# Patient Record
Sex: Male | Born: 1995 | Race: Black or African American | Hispanic: No | Marital: Single | Smoking: Never smoker
Health system: Southern US, Community
[De-identification: ages and names within clinical notes are randomized; demographics above are authoritative.]

## PROBLEM LIST (undated history)

## (undated) DIAGNOSIS — J40 Bronchitis, not specified as acute or chronic: Secondary | ICD-10-CM

## (undated) DIAGNOSIS — K219 Gastro-esophageal reflux disease without esophagitis: Secondary | ICD-10-CM

## (undated) DIAGNOSIS — K439 Ventral hernia without obstruction or gangrene: Secondary | ICD-10-CM

## (undated) DIAGNOSIS — I10 Essential (primary) hypertension: Secondary | ICD-10-CM

## (undated) DIAGNOSIS — T7840XA Allergy, unspecified, initial encounter: Secondary | ICD-10-CM

## (undated) DIAGNOSIS — R51 Headache: Secondary | ICD-10-CM

## (undated) DIAGNOSIS — J449 Chronic obstructive pulmonary disease, unspecified: Secondary | ICD-10-CM

## (undated) DIAGNOSIS — F419 Anxiety disorder, unspecified: Secondary | ICD-10-CM

## (undated) DIAGNOSIS — R519 Headache, unspecified: Secondary | ICD-10-CM

## (undated) HISTORY — DX: Anxiety disorder, unspecified: F41.9

## (undated) HISTORY — DX: Chronic obstructive pulmonary disease, unspecified: J44.9

## (undated) HISTORY — DX: Essential (primary) hypertension: I10

## (undated) HISTORY — PX: HERNIA REPAIR: SHX51

## (undated) HISTORY — DX: Allergy, unspecified, initial encounter: T78.40XA

---
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
## 2017-08-10 ENCOUNTER — Ambulatory Visit (HOSPITAL_COMMUNITY)
Admission: RE | Admit: 2017-08-10 | Discharge: 2017-08-10 | Disposition: A | Discharge status: Home / Self Care | Payer: BLUE CROSS/BLUE SHIELD | Source: Ambulatory Visit | Attending: Family Medicine | Admitting: Family Medicine

## 2017-08-10 ENCOUNTER — Other Ambulatory Visit: Payer: Self-pay

## 2017-08-10 ENCOUNTER — Ambulatory Visit (INDEPENDENT_AMBULATORY_CARE_PROVIDER_SITE_OTHER): Payer: BLUE CROSS/BLUE SHIELD | Admitting: Internal Medicine

## 2017-08-10 ENCOUNTER — Encounter: Payer: Self-pay | Admitting: Internal Medicine

## 2017-08-10 VITALS — BP 122/84 | HR 77 | Temp 98.6°F | Ht 74.5 in | Wt 174.0 lb

## 2017-08-10 DIAGNOSIS — K2900 Acute gastritis without bleeding: Secondary | ICD-10-CM | POA: Diagnosis not present

## 2017-08-10 DIAGNOSIS — K2970 Gastritis, unspecified, without bleeding: Secondary | ICD-10-CM | POA: Insufficient documentation

## 2017-08-10 DIAGNOSIS — R079 Chest pain, unspecified: Secondary | ICD-10-CM | POA: Insufficient documentation

## 2017-08-10 DIAGNOSIS — K5900 Constipation, unspecified: Secondary | ICD-10-CM | POA: Insufficient documentation

## 2017-08-10 MED ORDER — OMEPRAZOLE 40 MG PO CPDR: 40.0000 mg | DELAYED_RELEASE_CAPSULE | Freq: Every day | ORAL | 0 refills | Status: DC

## 2017-08-10 MED ORDER — POLYETHYLENE GLYCOL 3350 17 GM/SCOOP PO POWD: 1.0000 | Freq: Once | ORAL | 0 refills | Status: AC

## 2017-08-10 NOTE — Patient Instructions (Addendum)
It was so nice to see you!  For your heart- we did an EKG today that was completely normal. Your oxygen did not drop with walking, so I don't think your chest pain is coming from your heart. I would recommend taking Tylenol every 6 hours.   For your indigestion- I have prescribed a medication called Prilosec. Please take this once daily.  For your constipation- Please mix 1 capful into 8oz of water daily. If you do not have a good bowel movement, then use 1 capful twice a day. Keep increasing it until you have 1 good bowel movement per day.  We will see you back in 2-4 weeks.  -Dr. Brett Albino

## 2017-08-10 NOTE — Assessment & Plan Note (Signed)
Having to strain and only having 1 small bowel movement per day. - Start Miralax 1 capful daily. Increase until having 1 soft BM daily. - Follow-up in 2 weeks.

## 2017-08-10 NOTE — Assessment & Plan Note (Signed)
Patient with bloating, epigastric pain, and nausea. Likely worsened by recent Ibuprofen use for costochondritis. Not having severe pain, so think ulcer is less likely. - Prilosec 40mg  daily x 8 weeks - Advised patient to refrain from consuming spicy foods - Follow-up in 2 weeks to see if this is improving

## 2017-08-10 NOTE — Assessment & Plan Note (Addendum)
Likely secondary to costochondritis, given that patient has tenderness to palpation of the intercostal muscles on exam. GERD also likely contributing, as patient has been having a lot of heartburn (see separate problem). Per patient, he was told that he has an enlarged heart at a hospital in [place], but his discharge paperwork from that ED visit only notes costochondritis. Patient endorsing fatigue and shortness of breath, so concern that there may be something more serious going on such as viral myocarditis; however, patient does not have any lower extremity edema, JVD, or crackles to suggest heart failure and fluid overload. - EKG performed in clinic today and showed NSR without any signs of LVH - Patient ambulated briskly with pulse ox and O2 saturations decreased from 98% to 95-96%.  - Take Tylenol instead of Ibuprofen for pain (due to epigastric pain and heartburn- see separate problem) - Advised patient to refrain from strenuous exercise for now. Follow-up in 2 weeks for recheck. May need to consider ECHO if his symptoms persist. Will hold off on this for now, as he is not showing signs of fluid overload and he is well-appearing on exam. - Precepted with attending

## 2017-08-10 NOTE — Progress Notes (Signed)
[facility] Phone: [phone]  Subjective:  Jeffrey Rangel is a 21 year old male presenting to clinic with chest pain, heartburn, and constipation.  Chest Pain: Has been going on for the last two weeks. The pain is "sharp" and left sided. The pain comes and goes spontaneously. Not worse with walking or activity. The pain is worse with laying flat. He also notes shortness of breath with laying flat. The pain is better with taking a deep breathing. No lower extremity edema. He also endorses fatigue. No URI symptoms. He was seen in the ED in [place] for this. They "did a lot of different tests and he was told he had an enlarged heart". They brought the discharge instructions from the ED visit, which just notes costochondritis and recommends NSAIDs.   Heartburn/Epigastric Pain: Has been going on for the last two weeks. Also notes bloating. Feels like he has a lot of pressure in his upper abdomen that he can't release. Unable to burp or pass gas. Also has epigastric pain that is worse after eating. He endorses nausea, but has not had any vomiting. He has stopped eating spicy foods, which has helped. He has been taking Ibuprofen recently for his chest pain. No melena.  Constipation: Has been going on for a month. He is having bowel movements every day, but they are often very small and he is having to strain. No hematochezia. He has taken a laxative pill once before, which helped for a short period of time.  ROS: See HPI for pertinent positives and negatives  Past Medical History- allergic rhinitis, eczema  Family history reviewed for today's visit. No changes.  Social history- patient is a never smoker  Objective: BP 122/84   Pulse 77   Temp 98.6 F (37 C) (Oral)   Ht 6' 2.5" (1.892 m)   Wt 174 lb (78.9 kg)   SpO2 98%   BMI 22.04 kg/m  Gen: NAD, alert, well-appearing. HEENT: NCAT, EOMI, MMM Neck: FROM, supple, no JVD CV: RRR, no murmur, +tenderness to  palpation of the intercostal spaces on the left side of the chest Resp: CTABL, no wheezes, normal work of breathing GI: No epigastric pain, +mild tenderness in the LLQ, no rebound, no guarding Msk: No edema Neuro: Alert and oriented, no gross deficits Skin: No rashes, no lesions Psych: Appropriate behavior  Assessment/Plan: Chest Pain: Likely secondary to costochondritis, given that patient has tenderness to palpation of the intercostal muscles on exam. GERD also likely contributing, as patient has been having a lot of heartburn (see separate problem). Per patient, he was told that he has an enlarged heart at a hospital in [place], but his discharge paperwork from that ED visit only notes costochondritis. Patient endorsing fatigue and shortness of breath, so concern that there may be something more serious going on such as viral myocarditis; however, patient does not have any lower extremity edema, JVD, or crackles to suggest heart failure and fluid overload. - EKG performed in clinic today and showed NSR without any signs of LVH - Patient ambulated briskly with pulse ox and O2 saturations decreased from 98% to 95-96%.  - Take Tylenol instead of Ibuprofen for pain (due to epigastric pain and heartburn- see separate problem) - Advised patient to refrain from strenuous exercise for now. Follow-up in 2 weeks for recheck. May need to consider ECHO if his symptoms persist. Will hold off on this for now, as he is not showing signs of fluid overload and he is well-appearing  on exam. - Precepted with attending.  Gastritis: Patient with bloating, epigastric pain, and nausea. Likely worsened by recent Ibuprofen use for costochondritis. Not having severe pain, so think ulcer is less likely. - Prilosec 40mg  daily x 8 weeks - Advised patient to refrain from consuming spicy foods - Follow-up in 2 weeks to see if this is improving  Constipation: Having to strain and only having 1 small bowel movement  per day. - Start Miralax 1 capful daily. Increase until having 1 soft BM daily. - Follow-up in 2 weeks.   Hyman Bible, MD PGY-3

## 2017-08-17 ENCOUNTER — Telehealth: Payer: Self-pay | Admitting: *Deleted

## 2017-08-17 ENCOUNTER — Other Ambulatory Visit: Payer: Self-pay | Admitting: Internal Medicine

## 2017-08-17 MED ORDER — RANITIDINE HCL 150 MG PO TABS: 150.0000 mg | ORAL_TABLET | Freq: Every day | ORAL | 0 refills | Status: DC

## 2017-08-17 NOTE — Telephone Encounter (Signed)
Patient's mom left message on nurse line stating patient is having problems with omeprazole. States causing urinary problems and problems with "private parts." Requesting different medication. Hubbard Hartshorn, RN, BSN

## 2017-08-17 NOTE — Telephone Encounter (Signed)
New prescription sent to pharmacy. Thanks

## 2017-09-02 ENCOUNTER — Ambulatory Visit: Payer: BLUE CROSS/BLUE SHIELD | Admitting: Internal Medicine

## 2017-09-09 ENCOUNTER — Ambulatory Visit: Payer: Self-pay | Admitting: Internal Medicine

## 2017-09-27 ENCOUNTER — Encounter: Payer: Self-pay | Admitting: Family Medicine

## 2017-09-27 ENCOUNTER — Ambulatory Visit (INDEPENDENT_AMBULATORY_CARE_PROVIDER_SITE_OTHER): Payer: BLUE CROSS/BLUE SHIELD | Admitting: Family Medicine

## 2017-09-27 VITALS — BP 92/58 | HR 75 | Temp 98.8°F | Ht 74.5 in | Wt 171.2 lb

## 2017-09-27 DIAGNOSIS — K219 Gastro-esophageal reflux disease without esophagitis: Secondary | ICD-10-CM

## 2017-09-27 NOTE — Progress Notes (Signed)
    Subjective:  Jeffrey Rangel is a 22 y.o. male who presents to the [facility] today with a chief complaint of chest pressure and feelings of abdominal discomfort.   HPI:  Heartburn  Patient reports chest pressure, heartburn feelings of abdominal discomfort with urge but inability to burp.  He denies any shortness of breath, tachycardia, nausea, vomiting or diarrhea.  Was seen by Dr. Brett Albino on 08/10/2017 reporting feelings of bloating pressure and inability to burp or pass gas.  Also reported that certain foods including fried foods and spicy foods worsen his symptoms and was prescribed omeprazole 40 mg daily for a course of 8 weeks.  Symptoms improved over that time, however since stopping the medication he has begun eating fried foods again over the past few days and symptoms have been worsening over that time period.  PMH: Gastritis, constipation Tobacco use: non-smoker Medication: reviewed and updated ROS: see HPI   Objective:  Physical Exam: BP (!) 92/58   Pulse 75   Temp 98.8 F (37.1 C) (Oral)   Ht 6' 2.5" (1.892 m)   Wt 171 lb 3.2 oz (77.7 kg)   SpO2 96%   BMI 21.69 kg/m   Gen: 22 year old male in NAD, resting comfortably CV: RRR with no murmurs appreciated Pulm: NWOB, CTAB with no crackles, wheezes, or rhonchi GI: Normal bowel sounds present. Soft, Nontender, Nondistended. MSK: no edema, cyanosis, or clubbing noted Skin: warm, dry Neuro: grossly normal, moves all extremities Psych: Normal affect and thought content  No results found for this or any previous visit (from the past 72 hour(s)).   Assessment/Plan:  GERD Initially diagnosed with presumed gastritis 2 months ago and symptoms improved with course of omeprazole and change in diet essentially eliminating fried foods and other foods known to exacerbate GERD.  Since stopping the medication patient has had worsening of symptoms after be getting to take and fried foods.  Have recommended the patient continue an  additional 4 weeks of omeprazole, limited fried foods, chocolate, caffeine, spearmint, tobacco, alcohol and give himself at least 2 hours to digest before lying down.  Fortunately he has no red flag signs or symptoms at this time.  If he has no improvement of symptoms during this course could add carafate and/or consider workup for H. Pylori/endoscopy to rule out peptic ulcer.   Jacquelynne Guedes L. Rosalyn Gess, [place] Resident PGY-2 09/28/2017 10:27 AM

## 2017-09-27 NOTE — Patient Instructions (Signed)
Jeffrey Rangel, your seen today for chest and abdominal discomfort and reports of some acid reflux and feeling like you need to burp.  Most likely this is acid reflux.  Symptoms worsening over the past few days after eating fried chicken at work I think it is reasonable to think that your symptoms are all stemming from acid reflux.  I would continue the medication and Dr. Brett Albino prescribed you daily for the next 4 weeks and avoid fried foods, chocolate, spicy foods and any tobacco products.  Additionally you could avoid lying down for about 2 hours after eating and raise the head of your bed.   If you develop any vomiting with blood or in your stool for develop any fevers, chills please come back to be seen sooner.  Take care, [name], [facility] Resident PGY-2 09/27/2017 5:06 PM

## 2017-09-28 ENCOUNTER — Encounter: Payer: Self-pay | Admitting: Family Medicine

## 2017-12-20 ENCOUNTER — Ambulatory Visit (HOSPITAL_COMMUNITY)
Admission: EM | Admit: 2017-12-20 | Discharge: 2017-12-20 | Disposition: A | Discharge status: Home / Self Care | Payer: BLUE CROSS/BLUE SHIELD | Attending: Family Medicine | Admitting: Family Medicine

## 2017-12-20 ENCOUNTER — Encounter (HOSPITAL_COMMUNITY): Payer: Self-pay | Admitting: Emergency Medicine

## 2017-12-20 DIAGNOSIS — W57XXXA Bitten or stung by nonvenomous insect and other nonvenomous arthropods, initial encounter: Secondary | ICD-10-CM

## 2017-12-20 DIAGNOSIS — S40862A Insect bite (nonvenomous) of left upper arm, initial encounter: Secondary | ICD-10-CM

## 2017-12-20 DIAGNOSIS — L03818 Cellulitis of other sites: Secondary | ICD-10-CM

## 2017-12-20 DIAGNOSIS — S76311A Strain of muscle, fascia and tendon of the posterior muscle group at thigh level, right thigh, initial encounter: Secondary | ICD-10-CM

## 2017-12-20 MED ORDER — CEPHALEXIN 500 MG PO CAPS: 500.0000 mg | ORAL_CAPSULE | Freq: Two times a day (BID) | ORAL | 0 refills | Status: DC

## 2017-12-20 MED ORDER — IBUPROFEN 800 MG PO TABS: 800.0000 mg | ORAL_TABLET | Freq: Four times a day (QID) | ORAL | 0 refills | Status: DC | PRN

## 2017-12-20 NOTE — ED Provider Notes (Signed)
[facility]    CSN: [id number] Arrival date & time: 12/20/17  1346     History   Chief Complaint Chief Complaint  Patient presents with  . Insect Bite    HPI Jeffrey Rangel is a 22 y.o. male.   HPI Here for insect bite for 2 days Left deltoid Unknown insect He states that over the last 24 hours his arm has become more swollen and painful.   No Fever, chills. No known allergies to insect bites Otherwise healthy 22 year old  Past Medical History:  Diagnosis Date  . 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 Sinus headache     Patient Active Problem List   Diagnosis Date Noted  . Chest pain 08/10/2017  . Gastritis 08/10/2017  . Constipation 08/10/2017  . Supraumbilical hernia 88/50/2774  . ECZEMA 03/04/2009  . ALLERGIC RHINITIS 04/11/2007    Past Surgical History:  Procedure Laterality Date  . EPIGASTRIC HERNIA REPAIR N/A 03/06/2014   Procedure: HERNIA REPAIR EPIGASTRIC PEDIATRIC X2;  Surgeon: Jerilynn Mages. Gerald Stabs, MD;  Location: [facility];  Service: Pediatrics;  Laterality: N/A;  . SCROTAL EXPLORATION Bilateral 07/08/2013   Procedure: SCROTAL EXPLORATION WITH BILATERAL ORICHIOPEXY;  Surgeon: Franchot Gallo, MD;  Location: [facility];  Service: Urology;  Laterality: Bilateral;       Home Medications    Prior to Admission medications   Medication Sig Start Date End Date Taking? Authorizing Provider  cephALEXin (KEFLEX) 500 MG capsule Take 1 capsule (500 mg total) by mouth 2 (two) times daily. 12/20/17   Raylene Everts, MD  ibuprofen (ADVIL,MOTRIN) 800 MG tablet Take 1 tablet (800 mg total) by mouth every 6 (six) hours as needed. 12/20/17   Raylene Everts, MD    Family History Family History  Problem Relation Age of Onset  . Liver disease Father   . Hypertension Mother   . Asthma Mother   . Asthma Brother   . Diabetes Maternal Grandmother   . Diabetes Maternal Grandfather     Social History Social History   Tobacco Use  .  Smoking status: Never Smoker  . Smokeless tobacco: Never Used  Substance Use Topics  . Alcohol use: No  . Drug use: No     Allergies   Other   Review of Systems Review of Systems  Constitutional: Negative for appetite change, chills, fatigue and fever.  Skin: Positive for rash and wound.  All other systems reviewed and are negative.    Physical Exam Triage Vital Signs ED Triage Vitals [12/20/17 1357]  Enc Vitals Group     BP 119/76     Pulse Rate 67     Resp 18     Temp 98.3 F (36.8 C)     Temp src      SpO2 100 %     Weight      Height      Head Circumference      Peak Flow      Pain Score      Pain Loc      Pain Edu?      Excl. in [place]?    No data found.  Updated Vital Signs BP 119/76   Pulse 67   Temp 98.3 F (36.8 C)   Resp 18   SpO2 100%   Physical Exam  Constitutional: He appears well-developed and well-nourished. No distress.  HENT:  Head: Normocephalic and atraumatic.  Eyes: Conjunctivae are normal.  Neck: Neck supple.  Cardiovascular: Normal rate and regular  rhythm.  No murmur heard. Pulmonary/Chest: Effort normal and breath sounds normal. He has no wheezes.  Abdominal: Soft. There is no tenderness.  Musculoskeletal: Normal range of motion. He exhibits no edema.  Neurological: He is alert.  Skin: Skin is warm and dry.  Left deltoid has a 15 mm superficial eschar, surrounding 4 cm induration and erythema.  Moderately tender.  No axillary adenopathy.  Psychiatric: He has a normal mood and affect.  Nursing note and vitals reviewed.    UC Treatments / Results    Medications Ordered in UC Medications - No data to display  Initial Impression / Assessment and Plan / UC Course  I have reviewed the triage vital signs and the nursing notes.  Pertinent labs & imaging results that were available during my care of the patient were reviewed by me and considered in my medical decision making (see chart for details).     Discussed cellulitis.   Superficial infection.  Signs and symptoms of worsening infection, reasons to return.  He appears understand and agree Final Clinical Impressions(s) / UC Diagnoses   Final diagnoses:  Insect bite of left upper arm, initial encounter  Cellulitis of other specified site     Discharge Instructions     Warmth to area Take the antibiotic until pain and redness resolve Ibuprofen as needed pain Return as needed   ED Prescriptions    Medication Sig Dispense Auth. Provider   ibuprofen (ADVIL,MOTRIN) 800 MG tablet Take 1 tablet (800 mg total) by mouth every 6 (six) hours as needed. 20 tablet Raylene Everts, MD   cephALEXin (KEFLEX) 500 MG capsule Take 1 capsule (500 mg total) by mouth 2 (two) times daily. 10 capsule Raylene Everts, MD     Controlled Substance Prescriptions [place] Controlled Substance Registry consulted? Not Applicable   Raylene Everts, MD 12/20/17 [phone]

## 2017-12-20 NOTE — ED Triage Notes (Signed)
Pt c/o possible insect bite on L shoulder x3 days.

## 2017-12-20 NOTE — Discharge Instructions (Signed)
Warmth to area Take the antibiotic until pain and redness resolve Ibuprofen as needed pain Return as needed

## 2018-01-06 ENCOUNTER — Ambulatory Visit: Payer: Self-pay | Admitting: Internal Medicine

## 2018-07-26 ENCOUNTER — Ambulatory Visit: Payer: Self-pay

## 2018-12-09 ENCOUNTER — Ambulatory Visit (HOSPITAL_COMMUNITY)
Admission: EM | Admit: 2018-12-09 | Discharge: 2018-12-09 | Disposition: A | Discharge status: Home / Self Care | Payer: Medicaid Other

## 2018-12-09 ENCOUNTER — Other Ambulatory Visit: Payer: Self-pay

## 2018-12-09 ENCOUNTER — Ambulatory Visit (INDEPENDENT_AMBULATORY_CARE_PROVIDER_SITE_OTHER): Payer: Medicaid Other

## 2018-12-09 ENCOUNTER — Other Ambulatory Visit: Payer: Self-pay | Admitting: Family Medicine

## 2018-12-09 ENCOUNTER — Encounter (HOSPITAL_COMMUNITY): Payer: Self-pay | Admitting: Emergency Medicine

## 2018-12-09 DIAGNOSIS — J309 Allergic rhinitis, unspecified: Secondary | ICD-10-CM

## 2018-12-09 DIAGNOSIS — J302 Other seasonal allergic rhinitis: Secondary | ICD-10-CM

## 2018-12-09 DIAGNOSIS — R0789 Other chest pain: Secondary | ICD-10-CM

## 2018-12-09 MED ORDER — IPRATROPIUM BROMIDE 0.03 % NA SOLN: 2.0000 | Freq: Three times a day (TID) | NASAL | 0 refills | Status: DC | PRN

## 2018-12-09 MED ORDER — ALBUTEROL SULFATE HFA 108 (90 BASE) MCG/ACT IN AERS: 2.0000 | INHALATION_SPRAY | RESPIRATORY_TRACT | 1 refills | Status: DC | PRN

## 2018-12-09 MED ORDER — PREDNISONE 20 MG PO TABS: 40.0000 mg | ORAL_TABLET | Freq: Every day | ORAL | 0 refills | Status: AC

## 2018-12-09 NOTE — Discharge Instructions (Addendum)
Your chest x-ray is normal. If symptoms of chest tightness and shortness of breath worsen and do improve with prescribed treatment , please go immediately to the Emergency Department.  Use albuterol inhaler as needed for shortness of breath and chest tightness. Complete all of the prednisone prescription-this will help with chest tightness, shortness of breath, and improve sinus symptoms. Continue Zyrtec.  I have prescribed Atrovent nasal spray for use as needed 3 times daily for allergy nasal symptoms.

## 2018-12-09 NOTE — ED Provider Notes (Signed)
[facility]    CSN: [id number] Arrival date & time: 12/09/18  1005     History   Chief Complaint Chief Complaint  Patient presents with  . Allergies    HPI Jeffrey Rangel is a 23 y.o. male.   HPI  Presents with a complaint of two days of chest tightness and shortness of breath. He endorses mild wheezing although no coughing. He had to leave work yesterday due to chest tightness worsened. Chest tightness is exacerbated by breathing. He has also had one month of gradually worsening sinus pressure and nasal drainage (mucus has thickened in consistency) over the last month. Endorses facial pressure and eye irritation and puffiness. He intermittently takes cetirizine for allergy symptom without recent relief.  He has no asthma history, history of pneumonia or history of PE or DVT. Denies feverish, chills, nausea, or vomiting. Past Medical History:  Diagnosis Date  . 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 Sinus headache     Patient Active Problem List   Diagnosis Date Noted  . Chest pain 08/10/2017  . Gastritis 08/10/2017  . Constipation 08/10/2017  . Supraumbilical hernia 86/76/1948  . ECZEMA 03/04/2009  . ALLERGIC RHINITIS 04/11/2007    Past Surgical History:  Procedure Laterality Date  . EPIGASTRIC HERNIA REPAIR N/A 03/06/2014   Procedure: HERNIA REPAIR EPIGASTRIC PEDIATRIC X2;  Surgeon: Jerilynn Mages. Gerald Stabs, MD;  Location: [facility];  Service: Pediatrics;  Laterality: N/A;  . SCROTAL EXPLORATION Bilateral 07/08/2013   Procedure: SCROTAL EXPLORATION WITH BILATERAL ORICHIOPEXY;  Surgeon: Franchot Gallo, MD;  Location: [facility];  Service: Urology;  Laterality: Bilateral;       Home Medications    Prior to Admission medications   Medication Sig Start Date End Date Taking? Authorizing Provider  cephALEXin (KEFLEX) 500 MG capsule Take 1 capsule (500 mg total) by mouth 2 (two) times daily. 12/20/17   Raylene Everts, MD  ibuprofen (ADVIL,MOTRIN) 800 MG  tablet Take 1 tablet (800 mg total) by mouth every 6 (six) hours as needed. 12/20/17   Raylene Everts, MD    Family History Family History  Problem Relation Age of Onset  . Liver disease Father   . Hypertension Mother   . Asthma Mother   . Asthma Brother   . Diabetes Maternal Grandmother   . Diabetes Maternal Grandfather     Social History Social History   Tobacco Use  . Smoking status: Never Smoker  . Smokeless tobacco: Never Used  Substance Use Topics  . Alcohol use: No  . Drug use: No     Allergies   Other   Review of Systems Review of Systems Pertinent negatives listed in HPI Physical Exam Triage Vital Signs ED Triage Vitals  Enc Vitals Group     BP      Pulse      Resp      Temp      Temp src      SpO2      Weight      Height      Head Circumference      Peak Flow      Pain Score      Pain Loc      Pain Edu?      Excl. in [place]?    No data found.  Updated Vital Signs BP 131/82 (BP Location: Right Arm)   Pulse 65   Temp 98 F (36.7 C) (Oral)   Resp 18   SpO2 99%  Visual Acuity Right Eye Distance:   Left Eye Distance:   Bilateral Distance:    Right Eye Near:   Left Eye Near:    Bilateral Near:     Physical Exam Constitutional:      Appearance: Normal appearance.  HENT:     Head: Normocephalic.     Mouth/Throat:     Mouth: Mucous membranes are dry.  Eyes:     Pupils: Pupils are equal, round, and reactive to light.  Neck:     Musculoskeletal: Normal range of motion.  Cardiovascular:     Rate and Rhythm: Normal rate and regular rhythm.  Pulmonary:     Breath sounds: Decreased breath sounds present. No wheezing or rhonchi.  Chest:     Chest wall: Tenderness present.  Musculoskeletal: Normal range of motion.  Skin:    General: Skin is warm and dry.  Neurological:     General: No focal deficit present.     Mental Status: He is alert and oriented to person, place, and time.  Psychiatric:        Mood and Affect: Mood normal.         Behavior: Behavior normal.      UC Treatments / Results  Labs (all labs ordered are listed, but only abnormal results are displayed) Labs Reviewed - No data to display  EKG None  Radiology No results found.  Procedures Procedures (including critical care time)  Medications Ordered in UC Medications - No data to display  Initial Impression / Assessment and Plan / UC Course  I have reviewed the triage vital signs and the nursing notes.  Pertinent labs & imaging results that were available during my care of the patient were reviewed by me and considered in my medical decision making (see chart for details).     Chest x-ray unremarkable. Will treat for seasonal allergies likely causing reactive airway symptoms.  Short course of prednisone prescribed along with albuterol inhaler to reduce bronchial inflammation and improve work of breathing. Continue Zyrtec and prescribed Atrovent nasal spray for management of allergy symptoms. Red flags discussed. Work note provided. Patient verbalized understanding and agreement with plan. Final Clinical Impressions(s) / UC Diagnoses   Final diagnoses:  Chest tightness  Seasonal allergies  Allergic rhinitis, unspecified seasonality, unspecified trigger     Discharge Instructions     Your chest x-ray is normal. If symptoms of chest tightness and shortness of breath worsen and do improve with prescribed treatment , please go immediately to the Emergency Department.  Use albuterol inhaler as needed for shortness of breath and chest tightness. Complete all of the prednisone prescription-this will help with chest tightness, shortness of breath, and improve sinus symptoms. Continue Zyrtec.  I have prescribed Atrovent nasal spray for use as needed 3 times daily for allergy nasal symptoms.    ED Prescriptions    Medication Sig Dispense Auth. Provider   albuterol (VENTOLIN HFA) 108 (90 Base) MCG/ACT inhaler Inhale 2 puffs into the lungs  every 4 (four) hours as needed for wheezing or shortness of breath (cough, shortness of breath or wheezing.). 1 Inhaler Scot Jun, FNP   predniSONE (DELTASONE) 20 MG tablet Take 2 tablets (40 mg total) by mouth daily with breakfast for 5 days. 10 tablet Scot Jun, FNP     Controlled Substance Prescriptions [place] Controlled Substance Registry consulted? Not Applicable   Scot Jun, FNP 12/09/18 1152

## 2018-12-09 NOTE — ED Triage Notes (Addendum)
Patient left work early yesterday due to nasal congestion, sob, watery eyes.   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

## 2019-02-22 ENCOUNTER — Ambulatory Visit (INDEPENDENT_AMBULATORY_CARE_PROVIDER_SITE_OTHER): Payer: Self-pay | Admitting: Family Medicine

## 2019-02-22 ENCOUNTER — Other Ambulatory Visit: Payer: Self-pay

## 2019-02-22 ENCOUNTER — Encounter: Payer: Self-pay | Admitting: Family Medicine

## 2019-02-22 VITALS — BP 120/82 | HR 68

## 2019-02-22 DIAGNOSIS — K439 Ventral hernia without obstruction or gangrene: Secondary | ICD-10-CM

## 2019-02-22 MED ORDER — ALBUTEROL SULFATE HFA 108 (90 BASE) MCG/ACT IN AERS: 2.0000 | INHALATION_SPRAY | RESPIRATORY_TRACT | 1 refills | Status: DC | PRN

## 2019-02-22 NOTE — Assessment & Plan Note (Signed)
History of supraumbilical hernia status post repair.  Likely has recurrence or scar site ventral hernia, will refer to general surgery.

## 2019-02-22 NOTE — Progress Notes (Signed)
    Subjective:  Jeffrey Rangel is a 23 y.o. male who presents to the [facility] today with a chief complaint of ventral hernia.   HPI: Patient has abdominal hernia status post repair.  At one of the scar sites above from the repair, patient has what appears to be a ventral hernia at the lower scar site.  He says that he feels a slight abdominal discomfort and nausea as "something slides in and out of it".  There is no evidence of incarceration today on exam.  Patient is wanting it to be reevaluated.  Discussed with patient that is likely need referral to general surgery   Objective:  Physical Exam: BP 120/82   Pulse 68   SpO2 98%   Gen: NAD, resting comfortably Pulm: NWOB GI: 2 small laparoscopic abdominal scars, fascial defect felt beneath scar, however no observable hernia pouch to reduce, nontender to patient MSK: no edema, cyanosis, or clubbing noted Skin: warm, dry Neuro: grossly normal, moves all extremities Psych: Normal affect and thought content  No results found for this or any previous visit (from the past 72 hour(s)).   Assessment/Plan:  Supraumbilical hernia History of supraumbilical hernia status post repair.  Likely has recurrence or scar site ventral hernia, will refer to general surgery.   Lab Orders  No laboratory test(s) ordered today    Meds ordered this encounter  Medications  . albuterol (VENTOLIN HFA) 108 (90 Base) MCG/ACT inhaler    Sig: Inhale 2 puffs into the lungs every 4 (four) hours as needed for wheezing or shortness of breath (cough, shortness of breath or wheezing.).    Dispense:  18 g    Refill:  [place], MD, MS FAMILY MEDICINE RESIDENT - PGY2 02/22/2019 11:43 AM

## 2019-02-22 NOTE — Patient Instructions (Signed)
It was a pleasure to see you today! Thank you for choosing [facility] for your primary care. Jeffrey Rangel was seen for abdominal hernia.   We are referring you to general surgery.  As we discussed, come back to clinic or go to the emergency room if you develop worsening abdominal pain, nausea, vomiting.  Best,  Marny Lowenstein, MD, MS FAMILY MEDICINE RESIDENT - PGY2 02/22/2019 11:03 AM

## 2019-03-09 ENCOUNTER — Ambulatory Visit: Payer: Medicaid Other | Admitting: Family Medicine

## 2019-03-14 ENCOUNTER — Ambulatory Visit: Payer: Self-pay | Admitting: Surgery

## 2019-07-05 ENCOUNTER — Encounter (HOSPITAL_COMMUNITY): Payer: Self-pay | Admitting: Emergency Medicine

## 2019-07-05 ENCOUNTER — Ambulatory Visit (HOSPITAL_COMMUNITY)
Admission: EM | Admit: 2019-07-05 | Discharge: 2019-07-05 | Disposition: A | Discharge status: Home / Self Care | Payer: Medicaid Other | Attending: Family Medicine | Admitting: Family Medicine

## 2019-07-05 ENCOUNTER — Other Ambulatory Visit: Payer: Self-pay

## 2019-07-05 DIAGNOSIS — S39012A Strain of muscle, fascia and tendon of lower back, initial encounter: Secondary | ICD-10-CM

## 2019-07-05 DIAGNOSIS — S161XXA Strain of muscle, fascia and tendon at neck level, initial encounter: Secondary | ICD-10-CM

## 2019-07-05 MED ORDER — DICLOFENAC SODIUM 75 MG PO TBEC: 75.0000 mg | DELAYED_RELEASE_TABLET | Freq: Two times a day (BID) | ORAL | 0 refills | Status: DC

## 2019-07-05 MED ORDER — CYCLOBENZAPRINE HCL 10 MG PO TABS: ORAL_TABLET | ORAL | 0 refills | Status: DC
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
## 2019-07-05 NOTE — Discharge Instructions (Addendum)

## 2020-05-26 ENCOUNTER — Other Ambulatory Visit: Payer: Self-pay

## 2020-05-26 ENCOUNTER — Ambulatory Visit (INDEPENDENT_AMBULATORY_CARE_PROVIDER_SITE_OTHER): Payer: Self-pay | Admitting: Family Medicine

## 2020-05-26 ENCOUNTER — Encounter: Payer: Self-pay | Admitting: Family Medicine

## 2020-05-26 VITALS — BP 116/70 | HR 69 | Ht 77.0 in | Wt 181.2 lb

## 2020-05-26 DIAGNOSIS — K429 Umbilical hernia without obstruction or gangrene: Secondary | ICD-10-CM

## 2020-05-26 DIAGNOSIS — K439 Ventral hernia without obstruction or gangrene: Secondary | ICD-10-CM

## 2020-05-26 NOTE — Progress Notes (Signed)
° ° °  SUBJECTIVE:   CHIEF COMPLAINT / HPI: hernia  24 year old male patient presents with complaint of ventral hernia.  Patient had three ventral hernias noted in 2015, two were repaired by Dr. Virl Axe when patient was 23yo.  Since that time he has had increase in abdominal pain in his belly button that occur spontaneously for about 1-5 mins.  He is able to feel his bowel protruding through his bellybutton, and has so far always been able to easily reduce the bowel. He was seen by Dr. Grandville Silos in July 2020 for the same problem, and referred to general surgery.  He said he saw a general surgeon who recommended hernia repair, but he could not afford it as it was $1500 upfront.  Patient is not sure if he has active Medicaid, although Medicaid is listed as the primary coverage on epic.  PERTINENT  PMH / PSH: ventral hernia  OBJECTIVE:   BP 116/70    Pulse 69    Ht 6\' 5"  (1.956 m)    Wt 181 lb 4 oz (82.2 kg)    SpO2 98%    BMI 21.49 kg/m   Physical Exam Vitals and nursing note reviewed.  Constitutional:      General: He is not in acute distress.    Appearance: He is normal weight. He is not ill-appearing, toxic-appearing or diaphoretic.  Abdominal:     General: Abdomen is flat. Bowel sounds are normal. There is no distension.     Palpations: Abdomen is soft. There is no mass.     Tenderness: There is no abdominal tenderness.     Hernia: A hernia is present. Hernia is present in the umbilical area and ventral area.    Skin:    General: Skin is warm and dry.  Neurological:     General: No focal deficit present.     Mental Status: He is alert. Mental status is at baseline.  Psychiatric:        Mood and Affect: Mood normal.        Behavior: Behavior normal.      ASSESSMENT/PLAN:   No problem-specific Assessment & Plan notes found for this encounter.     Gladys Damme, MD [place]

## 2020-05-26 NOTE — Patient Instructions (Signed)
It was a pleasure to meet you today! I am sorry you are having this problem. I have referred you to general surgery again, you should expect a phone call in 1 to 2 weeks to schedule an appointment.  If you do not receive a phone call in that timeframe, please give our office a call and we can help make the appointment scheduling and referral process happen.  I recommend you check with the Department of Social Services to see if your Medicaid is active.  If not I recommend filling out the orange card form to find out if you are eligible.  Refer to instructions on the sheet.  If you have any signs of acute abdominal pain, sharp and stabbing, and can feel your bowel protruding into your bellybutton, I recommend trying to push your bowel back.  If you cannot get your bowel to go back in, or the pain is persistent beyond a few minutes, please go to the emergency department immediately as you may have strangulated bowel.  Feel better!  Dr. Chauncey Reading

## 2020-05-26 NOTE — Assessment & Plan Note (Signed)
H/o supraumbilical hernia s/p repair 2015 with likely recurrence in scar site. Will refer to general surgery. Recommend patient check with Department of Social Services to find out if medicaid is active. If not, gave patient orange card form with instructions to fill out to see if he qualifies. Strict return precautions given for bowel strangulation.

## 2020-07-16 ENCOUNTER — Ambulatory Visit (HOSPITAL_COMMUNITY)
Admission: EM | Admit: 2020-07-16 | Discharge: 2020-07-16 | Disposition: A | Discharge status: Home / Self Care | Payer: Medicaid Other | Attending: Emergency Medicine | Admitting: Emergency Medicine

## 2020-07-16 ENCOUNTER — Other Ambulatory Visit: Payer: Self-pay

## 2020-07-16 ENCOUNTER — Encounter (HOSPITAL_COMMUNITY): Payer: Self-pay

## 2020-07-16 DIAGNOSIS — Z202 Contact with and (suspected) exposure to infections with a predominantly sexual mode of transmission: Secondary | ICD-10-CM | POA: Insufficient documentation

## 2020-07-16 NOTE — ED Triage Notes (Signed)
Pt in for routine STI testing  Denies any urinary sxs or penile irritation or discharge

## 2020-07-16 NOTE — Discharge Instructions (Addendum)
Testing for gonorrhea, chlamydia and trichomoniasis are in process at this time. Please abstain from unprotected sex until STD testing results have posted your MyChart.

## 2020-07-16 NOTE — ED Provider Notes (Signed)
____________________________________________  Time seen: Approximately 1:17 PM  I have reviewed the triage vital signs and the nursing notes.   HISTORY  Chief Complaint sti testing   Historian Patient    HPI Jeffrey Rangel is a 24 y.o. male presents to the urgent care for routine STD testing.  Patient denies penile discharge, dysuria, low back pain, pain with sex, nausea or vomiting.  He has been afebrile at home.  No penile rash.  No other alleviating measures have been attempted.   Past Medical History:  Diagnosis Date  . 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 Sinus headache      Immunizations up to date:  Yes.     Past Medical History:  Diagnosis Date  . 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 Sinus headache     Patient Active Problem List   Diagnosis Date Noted  . Chest pain 08/10/2017  . Gastritis 08/10/2017  . Constipation 08/10/2017  . Supraumbilical hernia 79/09/4095  . ECZEMA 03/04/2009  . ALLERGIC RHINITIS 04/11/2007    Past Surgical History:  Procedure Laterality Date  . EPIGASTRIC HERNIA REPAIR N/A 03/06/2014   Procedure: HERNIA REPAIR EPIGASTRIC PEDIATRIC X2;  Surgeon: Jerilynn Mages. Gerald Stabs, MD;  Location: [facility];  Service: Pediatrics;  Laterality: N/A;  . SCROTAL EXPLORATION Bilateral 07/08/2013   Procedure: SCROTAL EXPLORATION WITH BILATERAL ORICHIOPEXY;  Surgeon: Franchot Gallo, MD;  Location: [facility];  Service: Urology;  Laterality: Bilateral;    Prior to Admission medications   Medication Sig Start Date End Date Taking? Authorizing Provider  albuterol (VENTOLIN HFA) 108 (90 Base) MCG/ACT inhaler Inhale 2 puffs into the lungs every 4 (four) hours as needed for wheezing or shortness of breath (cough, shortness of breath or wheezing.). 02/22/19   Bonnita Hollow, MD  cyclobenzaprine (FLEXERIL) 10 MG tablet Take 1 tablet by mouth 3 times daily as needed for muscle spasm. Warning: May cause drowsiness. 07/05/19   Vanessa Kick, MD  diclofenac  (VOLTAREN) 75 MG EC tablet Take 1 tablet (75 mg total) by mouth 2 (two) times daily. 07/05/19   Vanessa Kick, MD  cetirizine (ZYRTEC) 10 MG tablet Take 10 mg by mouth daily.  07/05/19  [provider]  ipratropium (ATROVENT) 0.03 % nasal spray Place 2 sprays into both nostrils 3 (three) times daily as needed for rhinitis. 12/09/18 07/05/19  Scot Jun, FNP    Allergies Other  Family History  Problem Relation Age of Onset  . Liver disease Father   . Hypertension Mother   . Asthma Mother   . Asthma Brother   . Diabetes Maternal Grandmother   . Diabetes Maternal Grandfather     Social History Social History   Tobacco Use  . Smoking status: Never Smoker  . Smokeless tobacco: Never Used  Substance Use Topics  . Alcohol use: No  . Drug use: No     Review of Systems  Constitutional: No fever/chills Eyes:  No discharge ENT: No upper respiratory complaints. Respiratory: no cough. No SOB/ use of accessory muscles to breath Gastrointestinal:   No nausea, no vomiting.  No diarrhea.  No constipation. Musculoskeletal: Negative for musculoskeletal pain. Skin: Negative for rash, abrasions, lacerations, ecchymosis.   ____________________________________________   PHYSICAL EXAM:  VITAL SIGNS: ED Triage Vitals  Enc Vitals Group     BP 07/16/20 1210 124/73     Pulse Rate 07/16/20 1210 74     Resp 07/16/20 1214 19     Temp 07/16/20 1210 98.4 F (36.9 C)  Temp Source 07/16/20 1210 Oral     SpO2 07/16/20 1210 100 %     Weight --      Height --      Head Circumference --      Peak Flow --      Pain Score 07/16/20 1209 0     Pain Loc --      Pain Edu? --      Excl. in [place]? --      Constitutional: Alert and oriented. Well appearing and in no acute distress. Eyes: Conjunctivae are normal. PERRL. EOMI. Head: Atraumatic. Cardiovascular: Normal rate, regular rhythm. Normal S1 and S2.  Good peripheral circulation. Respiratory: Normal respiratory effort  without tachypnea or retractions. Lungs CTAB. Good air entry to the bases with no decreased or absent breath sounds Gastrointestinal: Bowel sounds x 4 quadrants. Soft and nontender to palpation. No guarding or rigidity. No distention. Musculoskeletal: Full range of motion to all extremities. No obvious deformities noted Neurologic:  Normal for age. No gross focal neurologic deficits are appreciated.  Skin:  Skin is warm, dry and intact. No rash noted. Psychiatric: Mood and affect are normal for age. Speech and behavior are normal.   ____________________________________________   LABS (all labs ordered are listed, but only abnormal results are displayed)  Labs Reviewed  CYTOLOGY, (ORAL, ANAL, URETHRAL) ANCILLARY ONLY   ____________________________________________  EKG   ____________________________________________  RADIOLOGY   No results found.  ____________________________________________    PROCEDURES  Procedure(s) performed:     Procedures     Medications - No data to display   ____________________________________________   INITIAL IMPRESSION / ASSESSMENT AND PLAN / ED COURSE  Pertinent labs & imaging results that were available during my care of the patient were reviewed by me and considered in my medical decision making (see chart for details).    Assessment and plan STD exposure 24 year old male presents to the urgent care for routine STD testing.  Penile swab for gonorrhea, chlamydia and trichomoniasis is in process at this time.  Patient declined empiric treatment for STDs during this urgent care encounter.  Instructed patient that results with post to MyChart and care plan will be amended according to those results.  He voiced understanding.  All patient questions were answered.   ____________________________________________  FINAL CLINICAL IMPRESSION(S) / ED DIAGNOSES  Final diagnoses:  STD exposure      NEW MEDICATIONS STARTED DURING THIS  VISIT:  ED Discharge Orders    None          This chart was dictated using voice recognition software/Dragon. Despite best efforts to proofread, errors can occur which can change the meaning. Any change was purely unintentional.     Lannie Fields, PA-C 07/16/20 1319

## 2020-07-18 LAB — CYTOLOGY, (ORAL, ANAL, URETHRAL) ANCILLARY ONLY
Chlamydia: NEGATIVE
Comment: NEGATIVE
Comment: NEGATIVE
Comment: NORMAL
Neisseria Gonorrhea: NEGATIVE
Trichomonas: NEGATIVE

## 2020-08-21 ENCOUNTER — Ambulatory Visit: Payer: Self-pay | Admitting: Surgery

## 2020-08-27 ENCOUNTER — Emergency Department (HOSPITAL_COMMUNITY)
Admission: EM | Admit: 2020-08-27 | Discharge: 2020-08-28 | Disposition: A | Discharge status: Home / Self Care | Payer: 59 | Attending: Emergency Medicine | Admitting: Emergency Medicine

## 2020-08-27 ENCOUNTER — Other Ambulatory Visit: Payer: Self-pay

## 2020-08-27 ENCOUNTER — Emergency Department (HOSPITAL_COMMUNITY): Payer: 59

## 2020-08-27 ENCOUNTER — Encounter (HOSPITAL_COMMUNITY): Payer: Self-pay

## 2020-08-27 DIAGNOSIS — R109 Unspecified abdominal pain: Secondary | ICD-10-CM | POA: Diagnosis present

## 2020-08-27 DIAGNOSIS — R1033 Periumbilical pain: Secondary | ICD-10-CM

## 2020-08-27 DIAGNOSIS — K429 Umbilical hernia without obstruction or gangrene: Secondary | ICD-10-CM | POA: Diagnosis not present

## 2020-08-27 LAB — COMPREHENSIVE METABOLIC PANEL
ALT: 26 U/L (ref 0–44)
AST: 22 U/L (ref 15–41)
Albumin: 4.4 g/dL (ref 3.5–5.0)
Alkaline Phosphatase: 41 U/L (ref 38–126)
Anion gap: 10 (ref 5–15)
BUN: 9 mg/dL (ref 6–20)
CO2: 25 mmol/L (ref 22–32)
Calcium: 10.2 mg/dL (ref 8.9–10.3)
Chloride: 103 mmol/L (ref 98–111)
Creatinine, Ser: 1.19 mg/dL (ref 0.61–1.24)
GFR, Estimated: >60 mL/min (ref >60)
Glucose, Bld: 107 mg/dL — ABNORMAL HIGH (ref 70–99)
Potassium: 3.8 mmol/L (ref 3.5–5.1)
Sodium: 138 mmol/L (ref 135–145)
Total Bilirubin: 1.8 mg/dL — ABNORMAL HIGH (ref 0.3–1.2)
Total Protein: 7.4 g/dL (ref 6.5–8.1)

## 2020-08-27 LAB — CBC WITH DIFFERENTIAL/PLATELET
Abs Immature Granulocytes: 0.01 10*3/uL (ref 0.00–0.07)
Basophils Absolute: 0 10*3/uL (ref 0.0–0.1)
Basophils Relative: 0 %
Eosinophils Absolute: 0 10*3/uL (ref 0.0–0.5)
Eosinophils Relative: 0 %
HCT: 49.5 % (ref 39.0–52.0)
Hemoglobin: 16.3 g/dL (ref 13.0–17.0)
Immature Granulocytes: 0 %
Lymphocytes Relative: 19 %
Lymphs Abs: 1.6 10*3/uL (ref 0.7–4.0)
MCH: 28.4 pg (ref 26.0–34.0)
MCHC: 32.9 g/dL (ref 30.0–36.0)
MCV: 86.4 fL (ref 80.0–100.0)
Monocytes Absolute: 0.7 10*3/uL (ref 0.1–1.0)
Monocytes Relative: 8 %
Neutro Abs: 6.2 10*3/uL (ref 1.7–7.7)
Neutrophils Relative %: 73 %
Platelets: 176 10*3/uL (ref 150–400)
RBC: 5.73 MIL/uL (ref 4.22–5.81)
RDW: 13.4 % (ref 11.5–15.5)
WBC: 8.5 10*3/uL (ref 4.0–10.5)
nRBC: 0 % (ref 0.0–0.2)

## 2020-08-27 LAB — LIPASE, BLOOD: Lipase: 20 U/L (ref 11–51)

## 2020-08-27 MED ORDER — IOHEXOL 300 MG/ML  SOLN
100.0000 mL | Freq: Once | INTRAMUSCULAR | Status: AC | PRN
  Administered 2020-08-27: 100 mL via INTRAVENOUS

## 2020-08-27 MED ORDER — OXYCODONE-ACETAMINOPHEN 5-325 MG PO TABS
1.0000 | ORAL_TABLET | Freq: Once | ORAL | Status: AC
  Administered 2020-08-27: 1 via ORAL
  Filled 2020-08-27: qty 1

## 2020-08-27 NOTE — ED Triage Notes (Signed)
Pt has umbilical hernia, hx of repairs in the past, c.o severe pain that started last night. Some vomiting. Tearful in triage

## 2020-08-28 ENCOUNTER — Encounter (HOSPITAL_COMMUNITY): Payer: Self-pay | Admitting: Student

## 2020-08-28 MED ORDER — ACETAMINOPHEN 325 MG PO TABS
650.0000 mg | ORAL_TABLET | Freq: Once | ORAL | Status: AC
  Administered 2020-08-28: 650 mg via ORAL
  Filled 2020-08-28: qty 2

## 2020-08-28 MED ORDER — FAMOTIDINE IN NACL 20-0.9 MG/50ML-% IV SOLN
20.0000 mg | Freq: Once | INTRAVENOUS | Status: AC
  Administered 2020-08-28: 20 mg via INTRAVENOUS
  Filled 2020-08-28: qty 50

## 2020-08-28 MED ORDER — PANTOPRAZOLE SODIUM 40 MG PO TBEC: 40.0000 mg | DELAYED_RELEASE_TABLET | Freq: Every day | ORAL | 0 refills | Status: DC

## 2020-08-28 MED ORDER — POLYETHYLENE GLYCOL 3350 17 G PO PACK: 17.0000 g | PACK | Freq: Every day | ORAL | 0 refills | Status: DC | PRN

## 2020-08-28 MED ORDER — ONDANSETRON HCL 4 MG/2ML IJ SOLN
4.0000 mg | Freq: Once | INTRAMUSCULAR | Status: AC
  Administered 2020-08-28: 4 mg via INTRAVENOUS
  Filled 2020-08-28: qty 2

## 2020-08-28 MED ORDER — SODIUM CHLORIDE 0.9 % IV BOLUS
1000.0000 mL | Freq: Once | INTRAVENOUS | Status: AC
Start: 2020-08-28 — End: 2020-08-28
  Administered 2020-08-28: 1000 mL via INTRAVENOUS

## 2020-08-28 MED ORDER — METHOCARBAMOL 500 MG PO TABS: 500.0000 mg | ORAL_TABLET | Freq: Three times a day (TID) | ORAL | 0 refills | Status: DC | PRN

## 2020-08-28 MED ORDER — ONDANSETRON 4 MG PO TBDP: 4.0000 mg | ORAL_TABLET | Freq: Three times a day (TID) | ORAL | 0 refills | Status: DC | PRN

## 2020-08-28 MED ORDER — METHOCARBAMOL 1000 MG/10ML IJ SOLN
500.0000 mg | Freq: Once | INTRAVENOUS | Status: AC
  Administered 2020-08-28: 500 mg via INTRAVENOUS
  Filled 2020-08-28: qty 500

## 2020-08-28 MED ORDER — SUCRALFATE 1 GM/10ML PO SUSP: 1.0000 g | Freq: Three times a day (TID) | ORAL | 0 refills | Status: DC

## 2020-08-28 MED ORDER — METHOCARBAMOL 1000 MG/10ML IJ SOLN: 500.0000 mg | Freq: Once | INTRAMUSCULAR | Status: DC

## 2020-08-28 NOTE — Discharge Instructions (Signed)
You were seen in the emergency department today for abdominal pain.  Your blood work was overall reassuring, your total bilirubin was very mildly elevated, have this rechecked by your primary care provider within 1 to 2 weeks.  Your CT scan did not show any findings of an incarcerated hernia.  It did show that you have a small stone in your right kidney as well as a small calcified area in your lower abdomen that we feel is less likely to be a kidney stone.  We are sending you home with the following medications to help with your symptoms:  - Protonix- please take 1 tablet in the morning prior to any meals to help with stomach acidity/pain.  - Carafate- please take prior to each meal and prior to bedtime to help with stomach acidity/pain.  - Zofran- please take every 8 hours as needed for nausea/vomiting.  -Robaxin-this is a muscle relaxant take every 8 hours as needed for abdominal pain to help relax the muscles. Be aware that this medication may make you drowsy therefore the first time you take this it should be at a time you are in an environment where you can rest. Do not drive or operate heavy machinery when taking this medication. Do not drink alcohol or take other sedating medications with this medicine such as narcotics or benzodiazepines.  -MiraLAX-take daily as needed for constipation/difficulty with bowel movement  We have prescribed you new medication(s) today. Discuss the medications prescribed today with your pharmacist as they can have adverse effects and interactions with your other medicines including over the counter and prescribed medications. Seek medical evaluation if you start to experience new or abnormal symptoms after taking one of these medicines, seek care immediately if you start to experience difficulty breathing, feeling of your throat closing, facial swelling, or rash as these could be indications of a more serious allergic reaction  Please follow attached diet guidelines.    Follow up with your primary care provider and or Dr. Gerrit Friends within 3 days for re-evaluation.  Return to the ER for new or worsening symptoms including but not limited to worsened pain, new pain, inability to keep fluids down, vomiting coffee ground appearing content/blood, dark/tarry stool, fever, passing out, or any other concerns.

## 2020-08-28 NOTE — ED Provider Notes (Signed)
[facility] EMERGENCY DEPARTMENT Provider Note   CSN: [id number] Arrival date & time: 08/27/20  1258     History Chief Complaint  Patient presents with  . Hernia    Jeffrey Rangel is a 25 y.o. male with a history of epigastric hernia repair who presents to the emergency department with complaints of abdominal pain that began approximately 24 hours ago (around 2AM 01/05).  Patient states the pain is located in the periumbilical area, it is constant, sharp, worse with certain position changes/movements, no alleviating factors.  Pain is accompanied by nausea with multiple episodes of emesis.  He states his last bowel movement was approximately 1 hour following onset of pain, this was only a small amount and he had a bit of bright red blood on the toilet paper with this.  He states this feels similar to pain he has had with hernias in the past.  He recently saw Dr. Harlow Asa in the outpatient setting with plan for umbilical hernia repair.  He denies fever, chills, melena, dysuria, or testicular pain/swelling.  He does not think that he has been constipated.  HPI     Past Medical History:  Diagnosis Date  . 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 Sinus headache     Patient Active Problem List   Diagnosis Date Noted  . Chest pain 08/10/2017  . Gastritis 08/10/2017  . Constipation 08/10/2017  . Supraumbilical hernia [id number]  . ECZEMA 03/04/2009  . ALLERGIC RHINITIS 04/11/2007    Past Surgical History:  Procedure Laterality Date  . EPIGASTRIC HERNIA REPAIR N/A 03/06/2014   Procedure: HERNIA REPAIR EPIGASTRIC PEDIATRIC X2;  Surgeon: Jerilynn Mages. Gerald Stabs, MD;  Location: [facility];  Service: Pediatrics;  Laterality: N/A;  . SCROTAL EXPLORATION Bilateral 07/08/2013   Procedure: SCROTAL EXPLORATION WITH BILATERAL ORICHIOPEXY;  Surgeon: Franchot Gallo, MD;  Location: [facility];  Service: Urology;  Laterality: Bilateral;       Family History  Problem Relation Age  of Onset  . Liver disease Father   . Hypertension Mother   . Asthma Mother   . Asthma Brother   . Diabetes Maternal Grandmother   . Diabetes Maternal Grandfather     Social History   Tobacco Use  . Smoking status: Never Smoker  . Smokeless tobacco: Never Used  Substance Use Topics  . Alcohol use: No  . Drug use: No    Home Medications Prior to Admission medications   Medication Sig Start Date End Date Taking? Authorizing Provider  albuterol (VENTOLIN HFA) 108 (90 Base) MCG/ACT inhaler Inhale 2 puffs into the lungs every 4 (four) hours as needed for wheezing or shortness of breath (cough, shortness of breath or wheezing.). 02/22/19   Bonnita Hollow, MD  cyclobenzaprine (FLEXERIL) 10 MG tablet Take 1 tablet by mouth 3 times daily as needed for muscle spasm. Warning: May cause drowsiness. 07/05/19   Vanessa Kick, MD  diclofenac (VOLTAREN) 75 MG EC tablet Take 1 tablet (75 mg total) by mouth 2 (two) times daily. 07/05/19   Vanessa Kick, MD  cetirizine (ZYRTEC) 10 MG tablet Take 10 mg by mouth daily.  07/05/19  [provider]  ipratropium (ATROVENT) 0.03 % nasal spray Place 2 sprays into both nostrils 3 (three) times daily as needed for rhinitis. 12/09/18 07/05/19  Scot Jun, FNP    Allergies    Other  Review of Systems   Review of Systems  Constitutional: Negative for chills and fever.  Respiratory: Negative for cough and shortness  of breath.   Cardiovascular: Negative for chest pain.  Gastrointestinal: Positive for abdominal pain, anal bleeding, nausea and vomiting. Negative for constipation and diarrhea.  Genitourinary: Negative for dysuria, scrotal swelling and testicular pain.  Neurological: Negative for syncope.  All other systems reviewed and are negative.   Physical Exam Updated Vital Signs BP 128/74   Pulse 60   Temp 98.7 F (37.1 C)   Resp 18   SpO2 98%   Physical Exam Vitals and nursing note reviewed.  Constitutional:      General: He  is not in acute distress.    Appearance: He is well-developed. He is not toxic-appearing.  HENT:     Head: Normocephalic and atraumatic.  Eyes:     General:        Right eye: No discharge.        Left eye: No discharge.     Conjunctiva/sclera: Conjunctivae normal.  Cardiovascular:     Rate and Rhythm: Normal rate and regular rhythm.  Pulmonary:     Effort: Pulmonary effort is normal. No respiratory distress.     Breath sounds: Normal breath sounds. No wheezing, rhonchi or rales.  Abdominal:     General: There is no distension.     Palpations: Abdomen is soft.     Tenderness: There is abdominal tenderness in the epigastric area and periumbilical area. There is no guarding or rebound.     Hernia: A hernia (Very small palpable defect, reducible.) is present. Hernia is present in the umbilical area.  Genitourinary:    Comments: Patient refused Musculoskeletal:     Cervical back: Neck supple.  Skin:    General: Skin is warm and dry.     Findings: No rash.  Neurological:     Mental Status: He is alert.     Comments: Clear speech.   Psychiatric:        Behavior: Behavior normal.     ED Results / Procedures / Treatments   Labs (all labs ordered are listed, but only abnormal results are displayed) Labs Reviewed  COMPREHENSIVE METABOLIC PANEL - Abnormal; Notable for the following components:      Result Value   Glucose, Bld 107 (*)    Total Bilirubin 1.8 (*)    All other components within normal limits  CBC WITH DIFFERENTIAL/PLATELET  LIPASE, BLOOD    EKG None  Radiology CT ABDOMEN PELVIS W CONTRAST  Result Date: 08/27/2020 CLINICAL DATA:  25 year old male with acute abdominal pain and vomiting. History of prior umbilical hernia repair. EXAM: CT ABDOMEN AND PELVIS WITH CONTRAST TECHNIQUE: Multidetector CT imaging of the abdomen and pelvis was performed using the standard protocol following bolus administration of intravenous contrast. CONTRAST:  160mL OMNIPAQUE IOHEXOL 300  MG/ML  SOLN COMPARISON:  None. FINDINGS: Lower chest: Unremarkable Hepatobiliary: The liver and gallbladder are unremarkable. No biliary dilatation. Pancreas: Unremarkable Spleen: Unremarkable Adrenals/Urinary Tract: A punctate calcification is noted in the region of the RIGHT UVJ (series 3: Image 75), but there is no evidence of hydroureter or hydronephrosis. This more likely represents a non-urinary calcification rather than a nonobstructing UVJ calculus, but correlate clinically. A punctate nonobstructing RIGHT LOWER pole renal calculus is present. No other renal abnormalities are identified. The adrenal glands and bladder are otherwise unremarkable. Stomach/Bowel: Stomach is within normal limits. Appendix appears normal. No evidence of bowel wall thickening, distention, or inflammatory changes. Vascular/Lymphatic: No significant vascular findings are present. No enlarged abdominal or pelvic lymph nodes. Reproductive: Prostate is unremarkable. Other: No abdominal wall  hernia or abnormality. No abdominopelvic ascites. Musculoskeletal: No acute or significant osseous findings. IMPRESSION: 1. Punctate calcification in the region of the RIGHT UVJ without hydroureter or hydronephrosis. This more likely represents a non-urinary calcification rather than a nonobstructing UVJ calculus, but correlate clinically. 2. Punctate nonobstructing RIGHT LOWER pole renal calculus. 3. No other significant abnormalities. No evidence of abdominal wall hernia. Electronically Signed   By: Margarette Canada M.D.   On: 08/27/2020 16:21    Procedures Procedures (including critical care time)  Medications Ordered in ED Medications  methocarbamol (ROBAXIN) 500 mg in dextrose 5 % 50 mL IVPB (500 mg Intravenous New Bag/Given 08/28/20 0446)  oxyCODONE-acetaminophen (PERCOCET/ROXICET) 5-325 MG per tablet 1 tablet (1 tablet Oral Given 08/27/20 1426)  iohexol (OMNIPAQUE) 300 MG/ML solution 100 mL (100 mLs Intravenous Contrast Given 08/27/20 1600)   sodium chloride 0.9 % bolus 1,000 mL (1,000 mLs Intravenous New Bag/Given 08/28/20 0423)  famotidine (PEPCID) IVPB 20 mg premix (0 mg Intravenous Stopped 08/28/20 0445)  ondansetron (ZOFRAN) injection 4 mg (4 mg Intravenous Given 08/28/20 0419)  acetaminophen (TYLENOL) tablet 650 mg (650 mg Oral Given 08/28/20 0418)    ED Course  I have reviewed the triage vital signs and the nursing notes.  Pertinent labs & imaging results that were available during my care of the patient were reviewed by me and considered in my medical decision making (see chart for details).    MDM Rules/Calculators/A&P                         Patient presents to the ED with complaints of abdominal pain.  Patient is nontoxic and resting comfortably, vitals are fairly unremarkable.  On exam he has epigastric and periumbilical pain, there is a small palpable defect the umbilicus which is reducible.  No overlying skin changes.  Additional history obtained:  Additional history obtained from chart review nursing note reviewed  Lab Tests:  I reviewed and interpreted labs, which included:  CBC, CMP, and lipase: Very mildly elevated total bilirubin, grossly unremarkable otherwise.  Imaging Studies ordered:  CT abdomen/pelvis with contrast ordered prior to my assessment, I independently visualized and interpreted imaging which showed  1. Punctate calcification in the region of the RIGHT UVJ without hydroureter or hydronephrosis. This more likely represents a non-urinary calcification rather than a nonobstructing UVJ calculus, but correlate clinically. 2. Punctate nonobstructing RIGHT LOWER pole renal calculus. 3. No other significant abnormalities. No evidence of abdominal wall hernia.  Symptomatically ureteral lithiasis seems unlikely, suspect nonurinary calcification from a clinical standpoint.  Serial abdominal exams without peritoneal signs.  CT scan without findings of appendicitis, cholecystitis, perforation, or obstruction.   Exam and CT without findings of incarcerated or gangrenous hernia.  Unclear definitive etiology to patient's pain, potentially related to small hernia however no defect on CT, also considering PUD/gastritis as well as constipation.  Given patient did report some rectal bleeding I recommended a rectal exam, he refused this, given no reports of melena, hemoglobin/hematocrit are within normal limits and his BUN is within normal limits I do not suspect a significant acute upper GI bleed.  He was given symptomatic management in the emergency department and is able to tolerate p.o, feels better & ready to go home.  Will discharge home with similar symptomatic care and have him follow-up with his general surgeon and/or PCP. I discussed results, treatment plan, need for follow-up, and return precautions with the patient. Provided opportunity for questions, patient confirmed understanding and is  in agreement with plan.   Portions of this note were generated with Scientist, clinical (histocompatibility and immunogenetics). Dictation errors may occur despite best attempts at proofreading.  Final Clinical Impression(s) / ED Diagnoses Final diagnoses:  Periumbilical abdominal pain    Rx / DC Orders ED Discharge Orders         Ordered    ondansetron (ZOFRAN ODT) 4 MG disintegrating tablet  Every 8 hours PRN        08/28/20 0540    pantoprazole (PROTONIX) 40 MG tablet  Daily        08/28/20 0540    sucralfate (CARAFATE) 1 GM/10ML suspension  3 times daily with meals & bedtime        08/28/20 0540    methocarbamol (ROBAXIN) 500 MG tablet  Every 8 hours PRN        08/28/20 0540    polyethylene glycol (MIRALAX) 17 g packet  Daily PRN        08/28/20 0540           Cherly Anderson, PA-C 08/28/20 0545    Sabas Sous, MD 08/28/20 [phone]

## 2020-09-03 ENCOUNTER — Encounter (HOSPITAL_COMMUNITY)
Admission: RE | Admit: 2020-09-03 | Discharge: 2020-09-03 | Disposition: A | Discharge status: Home / Self Care | Payer: 59 | Source: Ambulatory Visit | Attending: Surgery | Admitting: Surgery

## 2020-09-03 ENCOUNTER — Encounter (HOSPITAL_COMMUNITY): Payer: Self-pay

## 2020-09-03 ENCOUNTER — Other Ambulatory Visit: Payer: Self-pay

## 2020-09-03 DIAGNOSIS — Z01812 Encounter for preprocedural laboratory examination: Secondary | ICD-10-CM | POA: Insufficient documentation

## 2020-09-03 HISTORY — DX: Gastro-esophageal reflux disease without esophagitis: K21.9

## 2020-09-03 HISTORY — DX: Bronchitis, not specified as acute or chronic: J40

## 2020-09-03 LAB — CBC
HCT: 46.7 % (ref 39.0–52.0)
Hemoglobin: 15.8 g/dL (ref 13.0–17.0)
MCH: 29.5 pg (ref 26.0–34.0)
MCHC: 33.8 g/dL (ref 30.0–36.0)
MCV: 87.1 fL (ref 80.0–100.0)
Platelets: 165 10*3/uL (ref 150–400)
RBC: 5.36 MIL/uL (ref 4.22–5.81)
RDW: 13.2 % (ref 11.5–15.5)
WBC: 5.3 10*3/uL (ref 4.0–10.5)
nRBC: 0 % (ref 0.0–0.2)

## 2020-09-03 NOTE — Progress Notes (Signed)
DUE TO COVID-19 ONLY ONE VISITOR IS ALLOWED TO COME WITH YOU AND STAY IN THE WAITING ROOM ONLY DURING PRE OP AND PROCEDURE DAY OF SURGERY. THE 1 VISITOR  MAY VISIT WITH YOU AFTER SURGERY IN YOUR PRIVATE ROOM DURING VISITING HOURS ONLY!  YOU NEED TO HAVE A COVID 19 TEST ON___1/18/2022 ____ @_______ , THIS TEST MUST BE DONE BEFORE SURGERY,  COVID TESTING SITE [address] [place] [address], IT IS ON THE RIGHT GOING OUT [address] APPROXIMATELY  2 MINUTES PAST [employer] ON THE RIGHT. ONCE YOUR COVID TEST IS COMPLETED,  PLEASE BEGIN THE QUARANTINE INSTRUCTIONS AS OUTLINED IN YOUR HANDOUT.                Jeffrey Rangel  09/03/2020   Your procedure is scheduled on:  09/12/2020   Report to [facility] Main  Entrance   Report to admitting at      [phone]     Call this number if you have problems the morning of surgery [phone]    REMEMBER: NO  SOLID FOOD CANDY OR GUM AFTER MIDNIGHT. CLEAR LIQUIDS UNTIL         0845am      . NOTHING BY MOUTH EXCEPT CLEAR LIQUIDS UNTIL    . PLEASE FINISH ENSURE DRINK PER SURGEON ORDER  WHICH NEEDS TO BE COMPLETED AT    0845am      CLEAR LIQUID DIET   Foods Allowed                                                                    Coffee and tea, regular and decaf                            Fruit ices (not with fruit pulp)                                      Iced Popsicles                                    Carbonated beverages, regular and diet                                    Cranberry, grape and apple juices Sports drinks like Gatorade Lightly seasoned clear broth or consume(fat free) Sugar, honey syrup ___________________________________________________________________      BRUSH YOUR TEETH MORNING OF SURGERY AND RINSE YOUR MOUTH OUT, NO CHEWING GUM CANDY OR MINTS.     Take these medicines the morning of surgery with A SIP OF WATER:   DO NOT TAKE ANY DIABETIC MEDICATIONS DAY OF YOUR SURGERY                                You may not have any metal on your body including hair pins and              piercings  Do not wear jewelry, make-up, lotions, powders or perfumes, deodorant             Do not wear nail polish on your fingernails.  Do not shave  48 hours prior to surgery.              Men may shave face and neck.   Do not bring valuables to the hospital. [place].  Contacts, dentures or bridgework may not be worn into surgery.  Leave suitcase in the car. After surgery it may be brought to your room.     Patients discharged the day of surgery will not be allowed to drive home. IF YOU ARE HAVING SURGERY AND GOING HOME THE SAME DAY, YOU MUST HAVE AN ADULT TO DRIVE YOU HOME AND BE WITH YOU FOR 24 HOURS. YOU MAY GO HOME BY TAXI OR UBER OR ORTHERWISE, BUT AN ADULT MUST ACCOMPANY YOU HOME AND STAY WITH YOU FOR 24 HOURS.  Name and phone number of your driver:  Special Instructions: N/A              Please read over the following fact sheets you were given: _____________________________________________________________________  [facility] - Preparing for Surgery Before surgery, you can play an important role.  Because skin is not sterile, your skin needs to be as free of germs as possible.  You can reduce the number of germs on your skin by washing with CHG (chlorahexidine gluconate) soap before surgery.  CHG is an antiseptic cleaner which kills germs and bonds with the skin to continue killing germs even after washing. Please DO NOT use if you have an allergy to CHG or antibacterial soaps.  If your skin becomes reddened/irritated stop using the CHG and inform your nurse when you arrive at Short Stay. Do not shave (including legs and underarms) for at least 48 hours prior to the first CHG shower.  You may shave your face/neck. Please follow these instructions carefully:  1.  Shower with CHG Soap the night before surgery and the  morning of  Surgery.  2.  If you choose to wash your hair, wash your hair first as usual with your  normal  shampoo.  3.  After you shampoo, rinse your hair and body thoroughly to remove the  shampoo.                           4.  Use CHG as you would any other liquid soap.  You can apply chg directly  to the skin and wash                       Gently with a scrungie or clean washcloth.  5.  Apply the CHG Soap to your body ONLY FROM THE NECK DOWN.   Do not use on face/ open                           Wound or open sores. Avoid contact with eyes, ears mouth and genitals (private parts).                       Wash face,  Genitals (private parts) with your normal soap.             6.  Wash  thoroughly, paying special attention to the area where your surgery  will be performed.  7.  Thoroughly rinse your body with warm water from the neck down.  8.  DO NOT shower/wash with your normal soap after using and rinsing off  the CHG Soap.                9.  Pat yourself dry with a clean towel.            10.  Wear clean pajamas.            11.  Place clean sheets on your bed the night of your first shower and do not  sleep with pets. Day of Surgery : Do not apply any lotions/deodorants the morning of surgery.  Please wear clean clothes to the hospital/surgery center.  FAILURE TO FOLLOW THESE INSTRUCTIONS MAY RESULT IN THE CANCELLATION OF YOUR SURGERY PATIENT SIGNATURE_________________________________  NURSE SIGNATURE__________________________________  ________________________________________________________________________

## 2020-09-04 ENCOUNTER — Observation Stay (HOSPITAL_COMMUNITY)
Admission: EM | Admit: 2020-09-04 | Discharge: 2020-09-05 | Disposition: A | Discharge status: Home / Self Care | Payer: 59 | Attending: Family Medicine | Admitting: Family Medicine

## 2020-09-04 ENCOUNTER — Emergency Department (HOSPITAL_COMMUNITY): Payer: 59

## 2020-09-04 ENCOUNTER — Encounter (HOSPITAL_COMMUNITY): Payer: Self-pay | Admitting: Emergency Medicine

## 2020-09-04 DIAGNOSIS — Z87891 Personal history of nicotine dependence: Secondary | ICD-10-CM | POA: Insufficient documentation

## 2020-09-04 DIAGNOSIS — R109 Unspecified abdominal pain: Secondary | ICD-10-CM | POA: Diagnosis present

## 2020-09-04 DIAGNOSIS — R509 Fever, unspecified: Secondary | ICD-10-CM

## 2020-09-04 DIAGNOSIS — R112 Nausea with vomiting, unspecified: Secondary | ICD-10-CM

## 2020-09-04 DIAGNOSIS — N179 Acute kidney failure, unspecified: Secondary | ICD-10-CM | POA: Insufficient documentation

## 2020-09-04 DIAGNOSIS — U071 COVID-19: Principal | ICD-10-CM | POA: Insufficient documentation

## 2020-09-04 LAB — COMPREHENSIVE METABOLIC PANEL
ALT: 17 U/L (ref 0–44)
AST: 34 U/L (ref 15–41)
Albumin: 4.1 g/dL (ref 3.5–5.0)
Alkaline Phosphatase: 32 U/L — ABNORMAL LOW (ref 38–126)
Anion gap: 15 (ref 5–15)
BUN: 14 mg/dL (ref 6–20)
CO2: 23 mmol/L (ref 22–32)
Calcium: 9.5 mg/dL (ref 8.9–10.3)
Chloride: 102 mmol/L (ref 98–111)
Creatinine, Ser: 1.68 mg/dL — ABNORMAL HIGH (ref 0.61–1.24)
GFR, Estimated: 59 mL/min — ABNORMAL LOW (ref >60)
Glucose, Bld: 100 mg/dL — ABNORMAL HIGH (ref 70–99)
Potassium: 4 mmol/L (ref 3.5–5.1)
Sodium: 140 mmol/L (ref 135–145)
Total Bilirubin: 1.8 mg/dL — ABNORMAL HIGH (ref 0.3–1.2)
Total Protein: 6.6 g/dL (ref 6.5–8.1)

## 2020-09-04 LAB — URINALYSIS, ROUTINE W REFLEX MICROSCOPIC
Bilirubin Urine: NEGATIVE
Glucose, UA: NEGATIVE mg/dL
Hgb urine dipstick: NEGATIVE
Ketones, ur: NEGATIVE mg/dL
Leukocytes,Ua: NEGATIVE
Nitrite: NEGATIVE
Protein, ur: NEGATIVE mg/dL
Specific Gravity, Urine: 1.016 (ref 1.005–1.030)
pH: 9 — ABNORMAL HIGH (ref 5.0–8.0)

## 2020-09-04 LAB — CBC
HCT: 42.9 % (ref 39.0–52.0)
Hemoglobin: 15.2 g/dL (ref 13.0–17.0)
MCH: 30 pg (ref 26.0–34.0)
MCHC: 35.4 g/dL (ref 30.0–36.0)
MCV: 84.6 fL (ref 80.0–100.0)
Platelets: 164 10*3/uL (ref 150–400)
RBC: 5.07 MIL/uL (ref 4.22–5.81)
RDW: 13.2 % (ref 11.5–15.5)
WBC: 12.5 10*3/uL — ABNORMAL HIGH (ref 4.0–10.5)
nRBC: 0 % (ref 0.0–0.2)

## 2020-09-04 LAB — I-STAT VENOUS BLOOD GAS, ED
Acid-Base Excess: 1 mmol/L (ref 0.0–2.0)
Bicarbonate: 26.6 mmol/L (ref 20.0–28.0)
Calcium, Ion: 1.19 mmol/L (ref 1.15–1.40)
HCT: 43 % (ref 39.0–52.0)
Hemoglobin: 14.6 g/dL (ref 13.0–17.0)
O2 Saturation: 69 %
Potassium: 3.5 mmol/L (ref 3.5–5.1)
Sodium: 142 mmol/L (ref 135–145)
TCO2: 28 mmol/L (ref 22–32)
pCO2, Ven: 44.7 mmHg (ref 44.0–60.0)
pH, Ven: 7.382 (ref 7.250–7.430)
pO2, Ven: 37 mmHg (ref 32.0–45.0)

## 2020-09-04 LAB — LIPASE, BLOOD: Lipase: 20 U/L (ref 11–51)

## 2020-09-04 LAB — LACTIC ACID, PLASMA: Lactic Acid, Venous: 4.4 mmol/L (ref 0.5–1.9)

## 2020-09-04 LAB — RESP PANEL BY RT-PCR (RSV, FLU A&B, COVID)  RVPGX2
Influenza A by PCR: NEGATIVE
Influenza B by PCR: NEGATIVE
Resp Syncytial Virus by PCR: NEGATIVE
SARS Coronavirus 2 by RT PCR: POSITIVE — AB

## 2020-09-04 MED ORDER — ACETAMINOPHEN 325 MG PO TABS
650.0000 mg | ORAL_TABLET | Freq: Once | ORAL | Status: AC
  Administered 2020-09-04: 650 mg via ORAL
  Filled 2020-09-04: qty 2

## 2020-09-04 MED ORDER — LACTATED RINGERS IV BOLUS (SEPSIS)
1000.0000 mL | Freq: Once | INTRAVENOUS | Status: AC
  Administered 2020-09-04: 1000 mL via INTRAVENOUS

## 2020-09-04 MED ORDER — VANCOMYCIN HCL 1250 MG/250ML IV SOLN: 1250.0000 mg | Freq: Two times a day (BID) | INTRAVENOUS | Status: DC

## 2020-09-04 MED ORDER — ONDANSETRON HCL 4 MG/2ML IJ SOLN
4.0000 mg | Freq: Once | INTRAMUSCULAR | Status: AC
  Administered 2020-09-04: 4 mg via INTRAVENOUS
  Filled 2020-09-04: qty 2

## 2020-09-04 MED ORDER — PROMETHAZINE HCL 25 MG/ML IJ SOLN
12.5000 mg | Freq: Once | INTRAMUSCULAR | Status: AC
  Administered 2020-09-04: 12.5 mg via INTRAVENOUS
  Filled 2020-09-04: qty 1

## 2020-09-04 MED ORDER — METRONIDAZOLE IN NACL 5-0.79 MG/ML-% IV SOLN
500.0000 mg | Freq: Once | INTRAVENOUS | Status: DC
Start: 2020-09-04 — End: 2020-09-04

## 2020-09-04 MED ORDER — FENTANYL CITRATE (PF) 100 MCG/2ML IJ SOLN
75.0000 ug | Freq: Once | INTRAMUSCULAR | Status: AC
  Administered 2020-09-04: 75 ug via INTRAVENOUS
  Filled 2020-09-04: qty 2

## 2020-09-04 MED ORDER — MORPHINE SULFATE (PF) 2 MG/ML IV SOLN
2.0000 mg | Freq: Once | INTRAVENOUS | Status: AC
  Administered 2020-09-04: 2 mg via INTRAVENOUS
  Filled 2020-09-04: qty 1

## 2020-09-04 MED ORDER — VANCOMYCIN HCL 1500 MG/300ML IV SOLN
1500.0000 mg | Freq: Once | INTRAVENOUS | Status: DC
  Filled 2020-09-04: qty 300

## 2020-09-04 MED ORDER — LACTATED RINGERS IV SOLN
INTRAVENOUS | Status: DC
Start: 2020-09-04 — End: 2020-09-05

## 2020-09-04 MED ORDER — VANCOMYCIN HCL IN DEXTROSE 1-5 GM/200ML-% IV SOLN: 1000.0000 mg | Freq: Once | INTRAVENOUS | Status: DC

## 2020-09-04 MED ORDER — IOHEXOL 300 MG/ML  SOLN
100.0000 mL | Freq: Once | INTRAMUSCULAR | Status: AC | PRN
  Administered 2020-09-04: 100 mL via INTRAVENOUS

## 2020-09-04 MED ORDER — PIPERACILLIN-TAZOBACTAM 3.375 G IVPB 30 MIN
3.3750 g | Freq: Once | INTRAVENOUS | Status: AC
  Administered 2020-09-04: 3.375 g via INTRAVENOUS
  Filled 2020-09-04: qty 50

## 2020-09-04 MED ORDER — SODIUM CHLORIDE 0.9 % IV SOLN: 2.0000 g | Freq: Once | INTRAVENOUS | Status: DC

## 2020-09-04 MED ORDER — SODIUM CHLORIDE 0.9 % IV BOLUS
1000.0000 mL | Freq: Once | INTRAVENOUS | Status: AC
  Administered 2020-09-04: 1000 mL via INTRAVENOUS

## 2020-09-04 NOTE — Progress Notes (Signed)
Pharmacy Antibiotic Note  Jeffrey Rangel is a 25 y.o. male admitted on 09/04/2020 with sepsis.  Pharmacy has been consulted for Vancomycin dosing.   Height: 6' 4.5" (194.3 cm) Weight: 83 kg (183 lb) IBW/kg (Calculated) : 87.95  Temp (24hrs), Avg:101.7 F (38.7 C), Min:98.6 F (37 C), Max:103.2 F (39.6 C)  Recent Labs  Lab 09/03/20 1324 09/04/20 1841 09/04/20 1938  WBC 5.3 12.5*  --   CREATININE  --  1.68*  --   LATICACIDVEN  --   --  4.4*    Estimated Creatinine Clearance: 81 mL/min (A) (by C-G formula based on SCr of 1.68 mg/dL (H)).    Allergies  Allergen Reactions  . Other     "green peppers"    Antimicrobials this admission: 1/13 Zosyn >>  1/13 Vancomycin >>   Dose adjustments this admission: N/a  Microbiology results: Pending   Plan:  - Vancomycin 1500 mg IV x 1 dose  - Followed by Vancomycin 1250mg  IV q12h - Est Calc AUC 553 - Monitor patients renal function and urine output  - De-escalate ABX when appropriate   Thank you for allowing pharmacy to be a part of this patient's care.  Duanne Limerick PharmD. BCPS 09/04/2020 10:56 PM

## 2020-09-04 NOTE — ED Notes (Signed)
EKG done due to heart rate.

## 2020-09-04 NOTE — ED Triage Notes (Addendum)
Patient BIB [facility] for abdominal pain and vomiting. Patient has hernia and is scheduled for repair on 1/21, history of anxiety. EMS reports patient was hyperventilating when they arrived on scene and his mother gave him one of her clonazepam. Patient continues to complain of abdominal pain and appears very anxious.

## 2020-09-04 NOTE — ED Provider Notes (Signed)
[facility] EMERGENCY DEPARTMENT Provider Note   CSN: [id number] Arrival date & time: 09/04/20  1715     History Chief Complaint  Patient presents with  . Abdominal Pain    Jeffrey Rangel is a 25 y.o. male.  HPI   Patient with no significant medical history presents to the emergency department with chief complaint of abdominal pain.  Patient endorses he has had worsening  Abdominal pain for the last week, he describes it as a sharp sensation in his abdomen with vomiting and nausea.  He endorses that he has a ventral hernia repaired about 7 years ago but over the last few months has noticed a mass in his abdomen and he is scheduled to have 2 ventral  hernias repaired on January 21 by Dr. Tera Helper.  He was recently seen in the emergency department on January 6 for similar complaint, all labwork and imaging were reassuring at that time.  He was discharged home.  He is now back here today with worsening pain.  He denies  alleviating factors, he has no other severe surgical history.  Patient denies nasal congestion, sore throat, cough, chest pain, urinary symptoms, worsening pedal edema.  Past Medical History:  Diagnosis Date  . Bronchitis    hx of   . 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 GERD (gastroesophageal reflux disease)     Patient Active Problem List   Diagnosis Date Noted  . Chest pain 08/10/2017  . Gastritis 08/10/2017  . Constipation 08/10/2017  . Supraumbilical hernia 96/78/9381  . ECZEMA 03/04/2009  . ALLERGIC RHINITIS 04/11/2007    Past Surgical History:  Procedure Laterality Date  . EPIGASTRIC HERNIA REPAIR N/A 03/06/2014   Procedure: HERNIA REPAIR EPIGASTRIC PEDIATRIC X2;  Surgeon: Jerilynn Mages. Gerald Stabs, MD;  Location: [place];  Service: Pediatrics;  Laterality: N/A;  . SCROTAL EXPLORATION Bilateral 07/08/2013   Procedure: SCROTAL EXPLORATION WITH BILATERAL ORICHIOPEXY;  Surgeon: Franchot Gallo, MD;  Location: [place];  Service:  Urology;  Laterality: Bilateral;       Family History  Problem Relation Age of Onset  . Liver disease Father   . Hypertension Mother   . Asthma Mother   . Asthma Brother   . Diabetes Maternal Grandmother   . Diabetes Maternal Grandfather     Social History   Tobacco Use  . Smoking status: Former Smoker    Quit date: 08/23/2017    Years since quitting: 3.0  . Smokeless tobacco: Former Systems developer    Types: Secondary school teacher  . Vaping Use: Never used  Substance Use Topics  . Alcohol use: Not Currently    Comment: none in 2 weeks   . Drug use: No    Home Medications Prior to Admission medications   Medication Sig Start Date End Date Taking? Authorizing Provider  methocarbamol (ROBAXIN) 500 MG tablet Take 1 tablet (500 mg total) by mouth every 8 (eight) hours as needed for muscle spasms (abdominal pain at hernia site). 08/28/20   Petrucelli, Samantha R, PA-C  ondansetron (ZOFRAN ODT) 4 MG disintegrating tablet Take 1 tablet (4 mg total) by mouth every 8 (eight) hours as needed for nausea or vomiting. 08/28/20   Petrucelli, Samantha R, PA-C  pantoprazole (PROTONIX) 40 MG tablet Take 1 tablet (40 mg total) by mouth daily. 08/28/20   Petrucelli, Samantha R, PA-C  polyethylene glycol (MIRALAX) 17 g packet Take 17 g by mouth daily as needed for moderate constipation. 08/28/20   Petrucelli, Glynda Jaeger, PA-C  sucralfate (CARAFATE) 1 GM/10ML suspension Take 10 mLs (1 g total) by mouth 4 (four) times daily -  with meals and at bedtime. 08/28/20   Petrucelli, Samantha R, PA-C  cetirizine (ZYRTEC) 10 MG tablet Take 10 mg by mouth daily.  07/05/19  [provider]  ipratropium (ATROVENT) 0.03 % nasal spray Place 2 sprays into both nostrils 3 (three) times daily as needed for rhinitis. 12/09/18 07/05/19  Scot Jun, FNP    Allergies    Other  Review of Systems   Review of Systems  Constitutional: Positive for chills and fever.  HENT: Negative for congestion and sore throat.    Respiratory: Negative for cough and shortness of breath.   Cardiovascular: Negative for chest pain.  Gastrointestinal: Positive for abdominal pain, constipation, nausea and vomiting.  Genitourinary: Negative for dysuria, enuresis and flank pain.  Musculoskeletal: Negative for back pain.  Skin: Negative for rash.  Neurological: Negative for dizziness and headaches.  Hematological: Does not bruise/bleed easily.    Physical Exam Updated Vital Signs BP (!) 98/52   Pulse (!) 106   Temp (!) 102.2 F (39 C) (Oral)   Resp 15   Ht 6' 4.5" (1.943 m)   Wt 83 kg   SpO2 99%   BMI 21.99 kg/m   Physical Exam Vitals and nursing note reviewed.  Constitutional:      General: He is not in acute distress.    Appearance: He is ill-appearing.  HENT:     Head: Normocephalic and atraumatic.     Nose: No congestion.     Mouth/Throat:     Mouth: Mucous membranes are moist.     Pharynx: Oropharynx is clear. No oropharyngeal exudate or posterior oropharyngeal erythema.  Eyes:     Conjunctiva/sclera: Conjunctivae normal.  Cardiovascular:     Rate and Rhythm: Regular rhythm. Tachycardia present.     Pulses: Normal pulses.     Heart sounds: No murmur heard. No friction rub. No gallop.   Pulmonary:     Effort: No respiratory distress.     Breath sounds: No wheezing, rhonchi or rales.  Abdominal:     General: There is no distension.     Palpations: Abdomen is soft.     Tenderness: There is no right CVA tenderness or left CVA tenderness.     Comments: Patient abdomen is nondistended, no rectal bowel sounds, dull to percussion.  He is extremely tender around his periumbilical area, there is no peritoneal sign, no rebound tenderness, negative McBurney point or Murphy sign.  He had negative CVA tenderness.  Musculoskeletal:     Right lower leg: No edema.     Left lower leg: No edema.  Skin:    General: Skin is warm and dry.  Neurological:     Mental Status: He is alert.  Psychiatric:         Mood and Affect: Mood normal.     ED Results / Procedures / Treatments   Labs (all labs ordered are listed, but only abnormal results are displayed) Labs Reviewed  RESP PANEL BY RT-PCR (RSV, FLU A&B, COVID)  RVPGX2 - Abnormal; Notable for the following components:      Result Value   SARS Coronavirus 2 by RT PCR POSITIVE (*)    All other components within normal limits  COMPREHENSIVE METABOLIC PANEL - Abnormal; Notable for the following components:   Glucose, Bld 100 (*)    Creatinine, Ser 1.68 (*)    Alkaline Phosphatase 32 (*)  Total Bilirubin 1.8 (*)    GFR, Estimated 59 (*)    All other components within normal limits  CBC - Abnormal; Notable for the following components:   WBC 12.5 (*)    All other components within normal limits  URINALYSIS, ROUTINE W REFLEX MICROSCOPIC - Abnormal; Notable for the following components:   pH 9.0 (*)    All other components within normal limits  LACTIC ACID, PLASMA - Abnormal; Notable for the following components:   Lactic Acid, Venous 4.4 (*)    All other components within normal limits  CBC WITH DIFFERENTIAL/PLATELET - Abnormal; Notable for the following components:   WBC 13.2 (*)    Platelets 147 (*)    Neutro Abs 11.0 (*)    Lymphs Abs 0.3 (*)    Monocytes Absolute 1.7 (*)    All other components within normal limits  CULTURE, BLOOD (ROUTINE X 2)  CULTURE, BLOOD (ROUTINE X 2)  URINE CULTURE  LIPASE, BLOOD  LACTIC ACID, PLASMA  LACTIC ACID, PLASMA  LACTIC ACID, PLASMA  PROTIME-INR  APTT  BLOOD GAS, VENOUS  I-STAT VENOUS BLOOD GAS, ED    EKG EKG Interpretation  Date/Time:  Thursday September 04 2020 19:35:28 EST Ventricular Rate:  124 PR Interval:  156 QRS Duration: 74 QT Interval:  308 QTC Calculation: 442 R Axis:   77 Text Interpretation: Sinus tachycardia Biatrial enlargement Cannot rule out Anterior infarct , age undetermined Abnormal ECG Sinus tachycardia, no STEMI Confirmed by Lavenia Atlas [phone]) on 09/04/2020  10:29:26 PM   Radiology CT Abdomen Pelvis W Contrast  Result Date: 09/04/2020 CLINICAL DATA:  Abdominal pain, fever EXAM: CT ABDOMEN AND PELVIS WITH CONTRAST TECHNIQUE: Multidetector CT imaging of the abdomen and pelvis was performed using the standard protocol following bolus administration of intravenous contrast. CONTRAST:  134mL OMNIPAQUE IOHEXOL 300 MG/ML  SOLN COMPARISON:  08/27/2020 FINDINGS: Lower chest: Lung bases are clear. No effusions. Heart is normal size. Hepatobiliary: No focal hepatic abnormality. Gallbladder unremarkable. Pancreas: No focal abnormality or ductal dilatation. Spleen: No focal abnormality.  Normal size. Adrenals/Urinary Tract: No adrenal abnormality. No focal renal abnormality. No stones or hydronephrosis. Urinary bladder is unremarkable. Stomach/Bowel: Stomach, large and small bowel grossly unremarkable. Low lying cecum. Appendix is normal. Vascular/Lymphatic: No evidence of aneurysm or adenopathy. Reproductive: No visible focal abnormality. Other: No free fluid or free air. Musculoskeletal: No acute bony abnormality. IMPRESSION: No acute findings. Electronically Signed   By: Rolm Baptise M.D.   On: 09/04/2020 21:15   DG Chest Port 1 View  Result Date: 09/04/2020 CLINICAL DATA:  Questionable sepsis - evaluate for abnormality Abdominal pain and vomiting. EXAM: PORTABLE CHEST 1 VIEW COMPARISON:  12/09/2018. FINDINGS: The cardiomediastinal contours are normal. The lungs are clear. Pulmonary vasculature is normal. No consolidation, pleural effusion, or pneumothorax. No acute osseous abnormalities are seen. IMPRESSION: Negative AP view of the chest. Electronically Signed   By: Keith Rake M.D.   On: 09/04/2020 22:30   DG Abdomen Acute W/Chest  Result Date: 09/04/2020 CLINICAL DATA:  25 year old male with abdominal pain. EXAM: DG ABDOMEN ACUTE WITH 1 VIEW CHEST COMPARISON:  CT abdomen pelvis dated 08/27/2020. FINDINGS: There is no evidence of dilated bowel loops or free  intraperitoneal air. No radiopaque calculi or other significant radiographic abnormality is seen. Heart size and mediastinal contours are within normal limits. Both lungs are clear. IMPRESSION: Negative abdominal radiographs.  No acute cardiopulmonary disease. Electronically Signed   By: Anner Crete M.D.   On: 09/04/2020 20:32  Procedures .Critical Care Performed by: Marcello Fennel, PA-C Authorized by: Marcello Fennel, PA-C   Critical care provider statement:    Critical care time (minutes):  45   Critical care time was exclusive of:  Separately billable procedures and treating other patients   Critical care was necessary to treat or prevent imminent or life-threatening deterioration of the following conditions:  Sepsis   Critical care was time spent personally by me on the following activities:  Discussions with consultants, evaluation of patient's response to treatment, examination of patient, ordering and performing treatments and interventions, ordering and review of laboratory studies, ordering and review of radiographic studies, pulse oximetry, re-evaluation of patient's condition and review of old charts   I assumed direction of critical care for this patient from another provider in my specialty: no     Care discussed with: admitting provider     (including critical care time)  Medications Ordered in ED Medications  lactated ringers infusion (has no administration in time range)  vancomycin (VANCOREADY) IVPB 1500 mg/300 mL (has no administration in time range)    Followed by  vancomycin (VANCOREADY) IVPB 1250 mg/250 mL (has no administration in time range)  sodium chloride 0.9 % bolus 1,000 mL (0 mLs Intravenous Stopped 09/04/20 2205)  acetaminophen (TYLENOL) tablet 650 mg (650 mg Oral Given 09/04/20 2008)  ondansetron (ZOFRAN) injection 4 mg (4 mg Intravenous Given 09/04/20 2009)  fentaNYL (SUBLIMAZE) injection 75 mcg (75 mcg Intravenous Given 09/04/20 2010)   piperacillin-tazobactam (ZOSYN) IVPB 3.375 g (0 g Intravenous Stopped 09/04/20 2154)  iohexol (OMNIPAQUE) 300 MG/ML solution 100 mL (100 mLs Intravenous Contrast Given 09/04/20 2045)  morphine 2 MG/ML injection 2 mg (2 mg Intravenous Given 09/04/20 2201)  promethazine (PHENERGAN) injection 12.5 mg (12.5 mg Intravenous Given 09/04/20 2203)  lactated ringers bolus 1,000 mL (1,000 mLs Intravenous New Bag/Given 09/04/20 2230)    And  lactated ringers bolus 1,000 mL (1,000 mLs Intravenous New Bag/Given 09/04/20 2210)    ED Course  I have reviewed the triage vital signs and the nursing notes.  Pertinent labs & imaging results that were available during my care of the patient were reviewed by me and considered in my medical decision making (see chart for details).    MDM Rules/Calculators/A&P                          Patient presents with abdominal pain fever and chills.  He is alert, appear to be in acute distress, vital signs show tachycardia and fever.  Concern for possible obstruction versus abdominal  infection.  Will obtain basic lab work, order imaging and reevaluate.  Patient continues to be febrile and tachycardic, patient has a lactic of 4.4, and blood pressure is soft.  Will activate code sepsis start him on broad-spectrum antibiotics.  Will consult with hospitalist for further recommendations.  Spoke with Dr. Janus Molder who has agreed to except the patient.  She will come down and evaluate the patient.  CBC shows leukocytosis 12.5, no anemia present.  CMP shows no electrolyte abnormalities, hyperglycemia 100, creatinine 1.6, increase in total bili 1.8, no anion gap present.  UA negative for nitrates, leukocytes, hematuria.  Lipase is 20.  Lactic acid 4.4.  Chest x-ray does not reveal any acute findings.  Abdominal pelvis did not reveal any acute findings.  EKG sinus tach without signs of ischemia no ST elevation or depression noted.  I have low suspicion for acute intra-abdominal abnormality  requiring  immediate intervention as CT imaging does not reveal any acute findings.  Low suspicion for UTI or pyelonephritis as UA negative for infection.  Low suspicion for pneumoperitoneum as abdomen is nondistended, nontympanic, no air noted in imaging.  Low suspicion for ACS as patient denies chest pain, shortness of breath, no signs of hypoperfusion fluid overload noted on exam.  Patient is noted to be tachycardic but I suspect this is secondary due to fever.  Low suspicion for meningitis as there is no meningeal signs on exam, patient is fully up-to-date on his childhood vaccine.  Low suspicion for septic arthritis as patient denies IV drug use, no erythematous swollen joints during my exam.  Patient has fever with unknown source, possible patient could be suffering from [place] but this would be atypical as he has an elevated white count, without respiratory complaints.  Dissipate patient will need further observation evaluation.  Patient care will be transferred to admitting team for further evaluation.   Final Clinical Impression(s) / ED Diagnoses Final diagnoses:  Fever, unspecified fever cause  COVID  Non-intractable vomiting with nausea, unspecified vomiting type    Rx / DC Orders ED Discharge Orders    None       Marcello Fennel, PA-C 09/05/20 0045    Lorelle Gibbs, DO 09/06/20 0006

## 2020-09-04 NOTE — ED Notes (Signed)
103 

## 2020-09-04 NOTE — ED Provider Notes (Incomplete)
[facility] EMERGENCY DEPARTMENT Provider Note   CSN: [id number] Arrival date & time: 09/04/20  1715     History Chief Complaint  Patient presents with  . Abdominal Pain    Jeffrey Rangel is a 25 y.o. male.  HPI   Patient with no significant medical history presents to the emergency department with chief complaint of abdominal pain.  Patient endorses he has had worsening  Abdominal pain for the last week, he describes it as a sharp sensation in his abdomen with vomiting and nausea.  He endorses that he has a ventral hernia repaired about 7 years ago but over the last few months has noticed a mass in his abdomen and he is scheduled to have 2 ventral  hernias repaired on January 21 by Dr. Tera Helper.  He was recently seen in the emergency department on January 6 for similar complaint, all labwork and imaging were reassuring at that time.  He was discharged home.  He is now back here today with worsening pain.  He denies  alleviating factors, he has no other severe surgical history.  Patient denies nasal congestion, sore throat, cough, chest pain, urinary symptoms, worsening pedal edema.  Past Medical History:  Diagnosis Date  . Bronchitis    hx of   . 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 GERD (gastroesophageal reflux disease)     Patient Active Problem List   Diagnosis Date Noted  . Chest pain 08/10/2017  . Gastritis 08/10/2017  . Constipation 08/10/2017  . Supraumbilical hernia 96/78/9381  . ECZEMA 03/04/2009  . ALLERGIC RHINITIS 04/11/2007    Past Surgical History:  Procedure Laterality Date  . EPIGASTRIC HERNIA REPAIR N/A 03/06/2014   Procedure: HERNIA REPAIR EPIGASTRIC PEDIATRIC X2;  Surgeon: Jerilynn Mages. Gerald Stabs, MD;  Location: [facility];  Service: Pediatrics;  Laterality: N/A;  . SCROTAL EXPLORATION Bilateral 07/08/2013   Procedure: SCROTAL EXPLORATION WITH BILATERAL ORICHIOPEXY;  Surgeon: Franchot Gallo, MD;  Location: [facility];  Service:  Urology;  Laterality: Bilateral;       Family History  Problem Relation Age of Onset  . Liver disease Father   . Hypertension Mother   . Asthma Mother   . Asthma Brother   . Diabetes Maternal Grandmother   . Diabetes Maternal Grandfather     Social History   Tobacco Use  . Smoking status: Former Smoker    Quit date: 08/23/2017    Years since quitting: 3.0  . Smokeless tobacco: Former Systems developer    Types: Secondary school teacher  . Vaping Use: Never used  Substance Use Topics  . Alcohol use: Not Currently    Comment: none in 2 weeks   . Drug use: No    Home Medications Prior to Admission medications   Medication Sig Start Date End Date Taking? Authorizing Provider  methocarbamol (ROBAXIN) 500 MG tablet Take 1 tablet (500 mg total) by mouth every 8 (eight) hours as needed for muscle spasms (abdominal pain at hernia site). 08/28/20   Petrucelli, Samantha R, PA-C  ondansetron (ZOFRAN ODT) 4 MG disintegrating tablet Take 1 tablet (4 mg total) by mouth every 8 (eight) hours as needed for nausea or vomiting. 08/28/20   Petrucelli, Samantha R, PA-C  pantoprazole (PROTONIX) 40 MG tablet Take 1 tablet (40 mg total) by mouth daily. 08/28/20   Petrucelli, Samantha R, PA-C  polyethylene glycol (MIRALAX) 17 g packet Take 17 g by mouth daily as needed for moderate constipation. 08/28/20   Petrucelli, Glynda Jaeger, PA-C  sucralfate (CARAFATE) 1 GM/10ML suspension Take 10 mLs (1 g total) by mouth 4 (four) times daily -  with meals and at bedtime. 08/28/20   Petrucelli, Samantha R, PA-C  cetirizine (ZYRTEC) 10 MG tablet Take 10 mg by mouth daily.  07/05/19  [provider]  ipratropium (ATROVENT) 0.03 % nasal spray Place 2 sprays into both nostrils 3 (three) times daily as needed for rhinitis. 12/09/18 07/05/19  Scot Jun, FNP    Allergies    Other  Review of Systems   Review of Systems  Constitutional: Positive for chills and fever.  HENT: Negative for congestion and sore throat.    Respiratory: Negative for cough and shortness of breath.   Cardiovascular: Negative for chest pain.  Gastrointestinal: Positive for abdominal pain, constipation, nausea and vomiting.  Genitourinary: Negative for dysuria, enuresis and flank pain.  Musculoskeletal: Negative for back pain.  Skin: Negative for rash.  Neurological: Negative for dizziness and headaches.  Hematological: Does not bruise/bleed easily.    Physical Exam Updated Vital Signs BP (!) 101/49 (BP Location: Left Arm)   Pulse (!) 117   Temp (!) 103.2 F (39.6 C) (Oral)   Resp (!) 22   SpO2 100%   Physical Exam Vitals and nursing note reviewed.  Constitutional:      General: He is not in acute distress.    Appearance: He is ill-appearing.  HENT:     Head: Normocephalic and atraumatic.     Nose: No congestion.     Mouth/Throat:     Mouth: Mucous membranes are moist.     Pharynx: Oropharynx is clear. No oropharyngeal exudate or posterior oropharyngeal erythema.  Eyes:     Conjunctiva/sclera: Conjunctivae normal.  Cardiovascular:     Rate and Rhythm: Regular rhythm. Tachycardia present.     Pulses: Normal pulses.     Heart sounds: No murmur heard. No friction rub. No gallop.   Pulmonary:     Effort: No respiratory distress.     Breath sounds: No wheezing, rhonchi or rales.  Abdominal:     General: There is no distension.     Palpations: Abdomen is soft.     Tenderness: There is no right CVA tenderness or left CVA tenderness.     Comments: Patient abdomen is nondistended, no rectal bowel sounds, dull to percussion.  He is extremely tender around his periumbilical area, there is no peritoneal sign, no rebound tenderness, negative McBurney point or Murphy sign.  He had negative CVA tenderness.  Musculoskeletal:     Right lower leg: No edema.     Left lower leg: No edema.  Skin:    General: Skin is warm and dry.  Neurological:     Mental Status: He is alert.  Psychiatric:        Mood and Affect: Mood  normal.     ED Results / Procedures / Treatments   Labs (all labs ordered are listed, but only abnormal results are displayed) Labs Reviewed  RESP PANEL BY RT-PCR (RSV, FLU A&B, COVID)  RVPGX2  LIPASE, BLOOD  COMPREHENSIVE METABOLIC PANEL  CBC  URINALYSIS, ROUTINE W REFLEX MICROSCOPIC  LACTIC ACID, PLASMA  LACTIC ACID, PLASMA    EKG None  Radiology No results found.  Procedures Procedures (including critical care time)  Medications Ordered in ED Medications  sodium chloride 0.9 % bolus 1,000 mL (has no administration in time range)  fentaNYL (SUBLIMAZE) injection 75 mcg (has no administration in time range)  piperacillin-tazobactam (ZOSYN) IVPB 3.375 g (  has no administration in time range)  acetaminophen (TYLENOL) tablet 650 mg (650 mg Oral Given 09/04/20 2008)  ondansetron [facility]) injection 4 mg (4 mg Intravenous Given 09/04/20 2009)    ED Course  I have reviewed the triage vital signs and the nursing notes.  Pertinent labs & imaging results that were available during my care of the patient were reviewed by me and considered in my medical decision making (see chart for details).    MDM Rules/Calculators/A&P                          Patient presents with abdominal pain fever and chills.  He is alert, appear to be in acute distress, vital signs show tachycardia and fever.  Concern for possible obstruction versus abdominal  infection.  Will obtain basic lab work, order imaging and reevaluate.  Patient continues to be febrile and tachycardic, patient has a lactic of 4.4, and blood pressure is soft.  Will activate code sepsis start him on broad-spectrum antibiotics.  CBC shows leukocytosis 12.5, no anemia present.  CMP shows no electrolyte abnormalities, hyperglycemia 100, creatinine 1.6, increase in total bili 1.8, no anion gap present.  UA negative for nitrates, leukocytes, hematuria.  Lipase is 20.  Lactic acid 4.4.  Chest x-ray does not reveal any acute findings.   Abdominal pelvis did not reveal any acute findings.  EKG sinus tach without signs of ischemia no ST elevation or depression noted. Final Clinical Impression(s) / ED Diagnoses Final diagnoses:  None    Rx / DC Orders ED Discharge Orders    None

## 2020-09-05 ENCOUNTER — Encounter (HOSPITAL_COMMUNITY): Payer: Self-pay | Admitting: Surgery

## 2020-09-05 DIAGNOSIS — R1110 Vomiting, unspecified: Secondary | ICD-10-CM | POA: Insufficient documentation

## 2020-09-05 DIAGNOSIS — U071 COVID-19: Secondary | ICD-10-CM | POA: Diagnosis not present

## 2020-09-05 DIAGNOSIS — K432 Incisional hernia without obstruction or gangrene: Secondary | ICD-10-CM | POA: Diagnosis present

## 2020-09-05 DIAGNOSIS — K429 Umbilical hernia without obstruction or gangrene: Secondary | ICD-10-CM | POA: Diagnosis present

## 2020-09-05 DIAGNOSIS — R112 Nausea with vomiting, unspecified: Secondary | ICD-10-CM

## 2020-09-05 LAB — CBC WITH DIFFERENTIAL/PLATELET
Abs Immature Granulocytes: 0.06 10*3/uL (ref 0.00–0.07)
Basophils Absolute: 0 10*3/uL (ref 0.0–0.1)
Basophils Relative: 0 %
Eosinophils Absolute: 0 10*3/uL (ref 0.0–0.5)
Eosinophils Relative: 0 %
HCT: 42.2 % (ref 39.0–52.0)
Hemoglobin: 13.9 g/dL (ref 13.0–17.0)
Immature Granulocytes: 1 %
Lymphocytes Relative: 2 %
Lymphs Abs: 0.3 10*3/uL — ABNORMAL LOW (ref 0.7–4.0)
MCH: 28.7 pg (ref 26.0–34.0)
MCHC: 32.9 g/dL (ref 30.0–36.0)
MCV: 87.2 fL (ref 80.0–100.0)
Monocytes Absolute: 1.7 10*3/uL — ABNORMAL HIGH (ref 0.1–1.0)
Monocytes Relative: 13 %
Neutro Abs: 11 10*3/uL — ABNORMAL HIGH (ref 1.7–7.7)
Neutrophils Relative %: 84 %
Platelets: 147 10*3/uL — ABNORMAL LOW (ref 150–400)
RBC: 4.84 MIL/uL (ref 4.22–5.81)
RDW: 13.3 % (ref 11.5–15.5)
WBC: 13.2 10*3/uL — ABNORMAL HIGH (ref 4.0–10.5)
nRBC: 0 % (ref 0.0–0.2)

## 2020-09-05 LAB — BASIC METABOLIC PANEL
Anion gap: 12 (ref 5–15)
Anion gap: 9 (ref 5–15)
BUN: 13 mg/dL (ref 6–20)
BUN: 11 mg/dL (ref 6–20)
CO2: 25 mmol/L (ref 22–32)
CO2: 26 mmol/L (ref 22–32)
Calcium: 9 mg/dL (ref 8.9–10.3)
Calcium: 8.9 mg/dL (ref 8.9–10.3)
Chloride: 103 mmol/L (ref 98–111)
Chloride: 104 mmol/L (ref 98–111)
Creatinine, Ser: 1.79 mg/dL — ABNORMAL HIGH (ref 0.61–1.24)
Creatinine, Ser: 1.47 mg/dL — ABNORMAL HIGH (ref 0.61–1.24)
GFR, Estimated: 54 mL/min — ABNORMAL LOW (ref >60)
GFR, Estimated: >60 mL/min (ref >60)
Glucose, Bld: 111 mg/dL — ABNORMAL HIGH (ref 70–99)
Glucose, Bld: 107 mg/dL — ABNORMAL HIGH (ref 70–99)
Potassium: 3.6 mmol/L (ref 3.5–5.1)
Potassium: 3.7 mmol/L (ref 3.5–5.1)
Sodium: 140 mmol/L (ref 135–145)
Sodium: 139 mmol/L (ref 135–145)

## 2020-09-05 LAB — LACTIC ACID, PLASMA: Lactic Acid, Venous: 2.3 mmol/L (ref 0.5–1.9)

## 2020-09-05 LAB — HIV ANTIBODY (ROUTINE TESTING W REFLEX): HIV Screen 4th Generation wRfx: NONREACTIVE

## 2020-09-05 MED ORDER — ENOXAPARIN SODIUM 40 MG/0.4ML ~~LOC~~ SOLN
40.0000 mg | Freq: Every day | SUBCUTANEOUS | Status: DC
  Administered 2020-09-05: 40 mg via SUBCUTANEOUS
  Filled 2020-09-05: qty 0.4

## 2020-09-05 MED ORDER — ACETAMINOPHEN 650 MG RE SUPP: 650.0000 mg | Freq: Four times a day (QID) | RECTAL | Status: DC | PRN

## 2020-09-05 MED ORDER — ACETAMINOPHEN 325 MG PO TABS
650.0000 mg | ORAL_TABLET | Freq: Four times a day (QID) | ORAL | Status: DC | PRN
  Administered 2020-09-05 (×2): 650 mg via ORAL
  Filled 2020-09-05 (×2): qty 2

## 2020-09-05 NOTE — Discharge Summary (Addendum)
[facility] Discharge Summary  Patient name: Jeffrey Rangel Medical record number: [id number] Date of birth: 07/14/1996 Age: 25 y.o. Gender: male Date of Admission: 09/04/2020  Date of Discharge: 09/05/2020  Admitting Physician: Lenoria Chime, MD  Primary Care Provider: Gladys Damme, MD Consultants: none  Indication for Hospitalization: Covid-19 infection  Discharge Diagnoses/Problem List:  Covid-19 infection AKI GERD Ventral hernias  Disposition: home  Discharge Condition: Stable  Discharge Exam:  General: Tired-appearing young male resting in bed, NAD CV: RRR, no murmurs Pulm: CTAB, decreased air movement, breathing comfortably on room air Abd: soft, mild diffuse discomfort, +BS Ext: WWP, no edema  Brief Hospital Course:  Jeffrey Rangel is a 25 y.o. male with no significant PMHx who presented with fever found to have Covid-19 infection. Hospital course outlined by problem below:  Covid-19 infection Patient presented initially with fever up to 103.2 F with tachycardia and tachypnea as well as mild leukocytosis.  Prior to COVID test result, sepsis work-up was initiated in the ED, and he was given IV fluids, 1 dose of piperacillin-tazobactam, and Tylenol. CXR and CT abdomen/pelvis were obtained and were unremarkable. He did not require any supplemental oxygen during hospitalization and so was not started on any antiviral medications or steroids.  Once patient's AKI below had showed signs of stability improvement he was discharged home with recommendation to continue isolation and consume plenty of fluids.  AKI Initial creatinine of 1.68 (baseline creatinine around 1.1).  Believed to be prerenal with patient having vomiting and low p.o. intake due to COVID-19.  Patient received IV fluid boluses and was started on maintenance IV fluids. Creatinine improved to 1.47 prior to discharge.    Issues for Follow Up:  1. Recommend repeat BMP in 7  days. 2. Recommend Covid vaccine once recovered.  Significant Procedures: none  Significant Labs and Imaging:  Recent Labs  Lab 09/03/20 1324 09/04/20 1841 09/04/20 2343 09/04/20 2350  WBC 5.3 12.5*  --  13.2*  HGB 15.8 15.2 14.6 13.9  HCT 46.7 42.9 43.0 42.2  PLT 165 164  --  147*   Recent Labs  Lab 09/04/20 1841 09/04/20 2343 09/05/20 0245 09/05/20 1100  NA 140 142 140 139  K 4.0 3.5 3.6 3.7  CL 102  --  103 104  CO2 23  --  25 26  GLUCOSE 100*  --  111* 107*  BUN 14  --  13 11  CREATININE 1.68*  --  1.79* 1.47*  CALCIUM 9.5  --  9.0 8.9  ALKPHOS 32*  --   --   --   AST 34  --   --   --   ALT 17  --   --   --   ALBUMIN 4.1  --   --   --      Results/Tests Pending at Time of Discharge: none  Discharge Medications:  Allergies as of 09/05/2020      Reactions   Other    "green peppers"      Medication List    TAKE these medications   methocarbamol 500 MG tablet Commonly known as: ROBAXIN Take 1 tablet (500 mg total) by mouth every 8 (eight) hours as needed for muscle spasms (abdominal pain at hernia site).   ondansetron 4 MG disintegrating tablet Commonly known as: Zofran ODT Take 1 tablet (4 mg total) by mouth every 8 (eight) hours as needed for nausea or vomiting.   pantoprazole 40 MG tablet Commonly known as:  Protonix Take 1 tablet (40 mg total) by mouth daily.   polyethylene glycol 17 g packet Commonly known as: MiraLax Take 17 g by mouth daily as needed for moderate constipation.       Discharge Instructions: Please refer to Patient Instructions section of EMR for full details.  Patient was counseled important signs and symptoms that should prompt return to medical care, changes in medications, dietary instructions, activity restrictions, and follow up appointments.   Follow-Up Appointments:   Zola Button, MD 09/05/2020, 2:04 PM PGY-1, [facility]   Upper Level Addendum:  I agree with the above documentation of Dr. Nancy Fetter.   I have reviewed their note and made necessary revisions as needed.    Lurline Del, DO 09/05/2020, 2:37 PM PGY-2, [place]

## 2020-09-05 NOTE — ED Notes (Signed)
Lunch Tray Ordered @ 1033. 

## 2020-09-05 NOTE — Hospital Course (Addendum)
Jeffrey Rangel is a 25 y.o. male with no significant PMHx who presented with fever found to have Covid-19 infection. Hospital course outlined by problem below:  Covid-19 infection Patient presented initially with fever up to 103.2 F with tachycardia and tachypnea as well as mild leukocytosis.  Prior to COVID test result, sepsis work-up was initiated in the ED, and he was given IV fluids, 1 dose of piperacillin-tazobactam, and Tylenol. CXR and CT abdomen/pelvis were obtained and were unremarkable. He did not require any supplemental oxygen during hospitalization and so was not started on any antiviral medications or steroids.  AKI Initial creatinine of 1.68 (baseline creatinine around 1.1). Patient received IV fluid boluses and was started on maintenance IV fluids. Creatinine improved to 1.47 prior to discharge.

## 2020-09-05 NOTE — H&P (Addendum)
[facility] Admission History and Physical Service Pager: [phone]  Patient name: Jeffrey Rangel Medical record number: [id number] Date of birth: 01-17-1996 Age: 25 y.o. Gender: male  Primary Care Provider: Gladys Damme, MD Consultants: None Code Status: FULL Preferred Emergency Contact: Wolfgang Phoenix (Mother)  Chief Complaint: Abdominal Pain  Assessment and Plan: Jeffrey Rangel is a 25 y.o. male presenting with abdominal pain, nausea, and vomiting who was found to be COVID positive. PMH is significant for ventral hernia x2 (scheduled for surgical repair on 09/12/2020) and GERD.  COVID-19 Patient presenting with GI symptoms and diffuse body aches x1 day. Reports his entire family has been sick but they were not tested for COVID. In the ED patient was febrile to 103.2, tachycardic to the 110s, and mildly tachypneic but with SpO2 99-100% on room air. The sepsis pathway was initiated in the ED (prior to known acute infection with COVID) and he was given 1L fluid bolus x2, Zosyn x1, and Tylenol. Labs remarkable for WBC 13.2, lactic acid 4.4 (repeat 2.3), and Cr 1.68. UA unremarkable, CXR normal, and CT abdomen/pelvis without acute findings. His COVID PCR subsequently returned positive. While his symptoms are presumably related to COVID, he was admitted for observation in the setting of his AKI and poor PO intake. Will do PO challenge, continue fluids, and repeat creatinine. Likely pending discharge home today.  -Admit to FPTS, med-surg, attending Dr. Thompson Grayer -Airborne/contact precautions -Vitals per routine -Tylenol q6h prn for fever -Treatment of AKI as discussed below  AKI Cr elevated to 1.68 on admission, from baseline of 1.1. Likely pre-renal in the setting of nausea/vomiting and poor PO intake. -s/p 1L LR bolus x2  -Continue LR @ 150 mL/hr -Repeat BMP later this morning -Encourage PO intake -Can add Zofran if needed for nausea/vomiting  Ventral  Hernias  Abdominal Pain Patient has known ventral hernias x2 and is scheduled for surgical repair on 09/12/2020. Home medications: robaxin, protonix, zofran, and miralax. No signs of incarceration or acute abdomen at this time. CT abd/pelvis unremarkable.  -Continue to monitor -Consider restarting home medications if patient stays beyond tomorrow afternoon   FEN/GI: Regular diet Prophylaxis: Lovenox  Disposition: med-surg  History of Present Illness:  Jeffrey Rangel is a 25 y.o. male presenting with abdominal pain, nausea, vomiting, and body aches. Patient reports his symptoms began earlier today. States he vomited x5 and hasn't been able to eat or drink much. Also endorses headache and was found to be febrile, but denies cough, congestion, or SOB. Multiple family members have been sick as well, but none of them were tested for COVID. Patient is unvaccinated.   Review Of Systems: Per HPI with the following additions:   Review of Systems  Constitutional: Positive for fever.  HENT: Negative for congestion and sore throat.   Respiratory: Negative for cough and shortness of breath.   Cardiovascular: Negative for chest pain.  Gastrointestinal: Positive for abdominal pain, nausea and vomiting.  Musculoskeletal: Positive for myalgias.  Skin: Negative for rash.  Neurological: Positive for headaches. Negative for syncope.     Patient Active Problem List   Diagnosis Date Noted  . Chest pain 08/10/2017  . Gastritis 08/10/2017  . Constipation 08/10/2017  . Supraumbilical hernia [id number]  . ECZEMA 03/04/2009  . ALLERGIC RHINITIS 04/11/2007    Past Medical History: Past Medical History:  Diagnosis Date  . Bronchitis    hx of   . 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 GERD (gastroesophageal reflux disease)  Past Surgical History: Past Surgical History:  Procedure Laterality Date  . EPIGASTRIC HERNIA REPAIR N/A 03/06/2014   Procedure: HERNIA REPAIR EPIGASTRIC PEDIATRIC X2;  Surgeon: Jerilynn Mages.  Gerald Stabs, MD;  Location: [facility];  Service: Pediatrics;  Laterality: N/A;  . SCROTAL EXPLORATION Bilateral 07/08/2013   Procedure: SCROTAL EXPLORATION WITH BILATERAL ORICHIOPEXY;  Surgeon: Franchot Gallo, MD;  Location: [facility];  Service: Urology;  Laterality: Bilateral;    Social History: Social History   Tobacco Use  . Smoking status: Former Smoker    Quit date: 08/23/2017    Years since quitting: 3.0  . Smokeless tobacco: Former Systems developer    Types: Secondary school teacher  . Vaping Use: Never used  Substance Use Topics  . Alcohol use: Not Currently    Comment: none in 2 weeks   . Drug use: No    Family History: Family History  Problem Relation Age of Onset  . Liver disease Father   . Hypertension Mother   . Asthma Mother   . Asthma Brother   . Diabetes Maternal Grandmother   . Diabetes Maternal Grandfather     Allergies and Medications: Allergies  Allergen Reactions  . Other     "green peppers"   No current facility-administered medications on file prior to encounter.   Current Outpatient Medications on File Prior to Encounter  Medication Sig Dispense Refill  . methocarbamol (ROBAXIN) 500 MG tablet Take 1 tablet (500 mg total) by mouth every 8 (eight) hours as needed for muscle spasms (abdominal pain at hernia site). 15 tablet 0  . ondansetron (ZOFRAN ODT) 4 MG disintegrating tablet Take 1 tablet (4 mg total) by mouth every 8 (eight) hours as needed for nausea or vomiting. 5 tablet 0  . pantoprazole (PROTONIX) 40 MG tablet Take 1 tablet (40 mg total) by mouth daily. 30 tablet 0  . polyethylene glycol (MIRALAX) 17 g packet Take 17 g by mouth daily as needed for moderate constipation. 14 each 0  . sucralfate (CARAFATE) 1 GM/10ML suspension Take 10 mLs (1 g total) by mouth 4 (four) times daily -  with meals and at bedtime. 420 mL 0  . [DISCONTINUED] cetirizine (ZYRTEC) 10 MG tablet Take 10 mg by mouth daily.    . [DISCONTINUED] ipratropium (ATROVENT) 0.03  % nasal spray Place 2 sprays into both nostrils 3 (three) times daily as needed for rhinitis. 30 mL 0    Objective: BP (!) 98/52   Pulse (!) 106   Temp (!) 102.2 F (39 C) (Oral)   Resp 15   Ht 6' 4.5" (1.943 m)   Wt 83 kg   SpO2 99%   BMI 21.99 kg/m  Exam: General: alert, no acute distress Eyes: normal sclera and conjunctiva ENTM: moist mucous membranes Neck: supple Cardiovascular: tachycardia, normal S1/S2 without m/r/g Respiratory: normal WOB on room air, lungs CTAB Gastrointestinal: +BS, soft, nontender, nondistended MSK: no peripheral edema Derm: no rashes noted Neuro: grossly intact Psych: appropriate affect  Labs and Imaging: CBC BMET  Recent Labs  Lab 09/04/20 2350  WBC 13.2*  HGB 13.9  HCT 42.2  PLT 147*   Recent Labs  Lab 09/04/20 1841 09/04/20 2343  NA 140 142  K 4.0 3.5  CL 102  --   CO2 23  --   BUN 14  --   CREATININE 1.68*  --   GLUCOSE 100*  --   CALCIUM 9.5  --      EKG: sinus tachycardia at 102bpm  CT  Abdomen Pelvis W Contrast Result Date: 09/04/2020 IMPRESSION: No acute findings. Electronically Signed   By: Rolm Baptise M.D.   On: 09/04/2020 21:15   DG Chest Port 1 View Result Date: 09/04/2020 IMPRESSION: Negative AP view of the chest. Electronically Signed   By: Keith Rake M.D.   On: 09/04/2020 22:30   DG Abdomen Acute W/Chest Result Date: 09/04/2020 IMPRESSION: Negative abdominal radiographs.  No acute cardiopulmonary disease. Electronically Signed   By: Anner Crete M.D.   On: 09/04/2020 20:32     Alcus Dad, MD 09/05/2020, 1:00 AM PGY-1, [facility] pager: [phone], text pages welcome  FPTS Upper-Level Resident Addendum   I have independently interviewed and examined the patient. I have discussed the above with the original author and agree with their documentation. My edits for correction/addition/clarification are in [place]. Please see also any attending notes.   Gerlene Fee,  DO PGY-2, [facility] 09/05/2020 1:55 AM  FPTS Service pager: [phone] (text pages welcome through [facility])

## 2020-09-05 NOTE — ED Notes (Signed)
Patient drink water and tolerated well

## 2020-09-05 NOTE — Discharge Instructions (Signed)
Dear Jeffrey Rangel,   Thank you so much for allowing [place] to be part of your care!  You were admitted to [facility] for Covid infection and kidney damage.  Your kidney function was only slightly impaired and we think this was due to dehydration.  Your blood work showed that your kidney function was improving.   POST-HOSPITAL & CARE INSTRUCTIONS 1. Please quarantine at home for 21 days until February 3rd. 2. Make sure to drink plenty of fluids and stay hydrated. 3. Do not take any NSAIDs (ibuprofen, Advil, Aleve, Motrin, etc.) until you are recovered. These medications can be harmful to the kidney. 4. You can take Tylenol for your symptoms. 5. Please consider getting the Covid vaccine when you have recovered. 6. Please follow-up with your primary care provider once you are recovered. 7. Please let PCP/Specialists know of any changes that were made.  8. Please see medications section of this packet for any medication changes.   DOCTOR'S APPOINTMENT & FOLLOW UP CARE INSTRUCTIONS  Future Appointments  Date Time Provider [place]  09/09/2020 12:05 PM [facility] None    RETURN PRECAUTIONS: Please return if you are developing severe shortness of breath.  Take care and be well!  [facility]  [place], [place] [address] [phone]

## 2020-09-05 NOTE — H&P (Signed)
General Surgery [facility]  Jeffrey Rangel DOB: 1996/03/09 Single / Language: [name] / Race: Black or African American Male   History of Present Illness   The patient is a 25 year old male who presents with an umbilical hernia.  CHIEF COMPLAINT: umbilical hernia, incisional hernia  Patient is self-referred. Patient has had 2 prior surgical procedures for small ventral hernias several years ago. Patient has developed pain at the site of the inferior surgical incision in the midline. He has noted a bulge which has required manual reduction. It is tender to palpation. He presents today for evaluation. He is also had a long-standing umbilical hernia which is been largely asymptomatic but is tender to palpation. He has had no signs or symptoms of intestinal obstruction. He is had no other significant abdominal surgery. Patient was evaluated in July 2020. He was scheduled for surgical repair. Unfortunately, due to financial concerns, he was unable to proceed with surgery at that time. Patient states that the symptoms from his hernias have become worse. He returns today in hopes of proceeding with surgery in the near future. He is requesting pain medication.   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 Caffeine use  Tea. No drug use  Tobacco use  Former smoker.  Family History  Thyroid problems  Mother.  Other Problems  Gastric Ulcer  Inguinal Hernia   Vitals  Weight: 183.38 lb  Height: 77in Body Surface Area: 2.16 m Body Mass Index: 21.74 kg/m  Temp.: 97.21F  Pulse: 96 (Regular)  BP: 120/72(Sitting, Left Arm, Standard)  Physical Exam  GENERAL APPEARANCE Development: normal Nutritional status: normal Gross deformities: none  SKIN Rash, lesions, ulcers: none Induration, erythema: none Nodules: none palpable  EYES Conjunctiva and lids: normal Pupils: equal and reactive Iris: normal bilaterally  EARS, NOSE, MOUTH, THROAT External ears: no lesion or deformity External nose: no lesion or deformity Hearing: grossly normal Due to Covid-19 pandemic, patient is wearing a mask.  NECK Symmetric: yes Trachea: midline Thyroid: no palpable nodules in the thyroid bed  CHEST Respiratory effort: normal Retraction or accessory muscle use: no Breath sounds: normal bilaterally Rales, rhonchi, wheeze: none  CARDIOVASCULAR Auscultation: regular rhythm, normal rate Murmurs: none Pulses: radial pulse 2+ palpable Lower extremity edema: none  ABDOMEN Distension: none Masses: none palpable Tenderness: none Hepatosplenomegaly: not present Hernia: At the base of the umbilicus is a small fascial defect measuring less than 1 cm in diameter. With Valsalva the hernia augments with likely a small amount of adipose tissue. This is mildly tender to palpation. It is easily reducible. There is an incision in the midline approximately 4-5 cm above the umbilicus where the patient states he underwent a prior ventral hernia repair. He states this area also bulges and requires manual reduction frequently. It causes him discomfort.  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 Patient did not undergo surgical repair in 2020 due to financial concerns.  On examination today, the patient does have a small umbilical hernia and a small ventral hernia which is recurrent. He would like to proceed with operative repair of both of these in the near future. This would be an outpatient surgical procedure. We would plan to insert a small mesh patch under each of the defects. Patient would be restricted in his physical activity for several weeks following the procedure. There is a risk of recurrence. The patient understands and wishes to proceed with surgery in the near future.  Our business office will assist with concerns related to the [facility] program.  The risks and benefits of the procedure have been discussed at length with the patient. The patient understands the proposed procedure, potential alternative treatments, and the course of recovery to be expected. All of the patient's questions have been answered at this time. The patient wishes to proceed with surgery.  Armandina Gemma, MD [facility] Office: [phone]

## 2020-09-06 LAB — URINE CULTURE: Culture: NO GROWTH

## 2020-09-09 ENCOUNTER — Other Ambulatory Visit (HOSPITAL_COMMUNITY): Payer: 59

## 2020-09-09 NOTE — Progress Notes (Addendum)
Patient positive for COVID on 09/04/20 patient does not need to be retested for COVID for 90 days after that positive result.  Patients surgery will be post poaned for 10 days unless the case is urgent/emergent then the case can proceed  Contacted [facility] spoke with Earlean Shawl at 8032 am to make her aware that patients procedure is currently scheduled too soon from his positive result.  Test day being day 0 patients surgery is scheduled on day 8 of quarantine. Not sure if this case is urgent/emergent.     1:14 PM patient arrived for COVID test today, instructed patient that he did not need to be tested again prior to surgery.    1:15 PM contacted Dr. Gala Lewandowsky office left message for Caryl Pina (surgery scheduler) about patients surgery only being 8 days out from quarantine.  Explained that if it is not urgent/emergent then patient should be reschedule on 09/15/20 at the earliest

## 2020-09-10 ENCOUNTER — Telehealth: Payer: Self-pay | Admitting: Family Medicine

## 2020-09-10 LAB — CULTURE, BLOOD (ROUTINE X 2)
Culture: NO GROWTH
Culture: NO GROWTH
Special Requests: ADEQUATE
Special Requests: ADEQUATE

## 2020-09-10 NOTE — Telephone Encounter (Signed)
[employer] form dropped off for at front desk for completion.  Verified that patient section of form has been completed.  Last DOS/WCC with PCP was 09/10/20.  Placed form in team folder to be completed by clinical staff.  Creig Hines

## 2020-09-11 NOTE — Telephone Encounter (Signed)
Just needs a letter to opt out of [employer] duty due to having surgery on 09/12/20. Salvatore Marvel, CMA

## 2020-09-11 NOTE — Telephone Encounter (Signed)
Clinical info completed on Jury Duty form.  Place form in PCP's box for completion.  Salvatore Marvel, CMA

## 2020-09-12 DIAGNOSIS — K429 Umbilical hernia without obstruction or gangrene: Secondary | ICD-10-CM | POA: Diagnosis present

## 2020-09-12 DIAGNOSIS — K432 Incisional hernia without obstruction or gangrene: Secondary | ICD-10-CM | POA: Diagnosis present

## 2020-09-12 NOTE — Telephone Encounter (Signed)
Letter completed. Please call patient to pick up.  Gladys Damme, MD [facility], PGY-2

## 2020-09-15 NOTE — Telephone Encounter (Signed)
Patient calls nurse line to check status of letter for jury duty. Informed patient that letter was ready for pick up.   Talbot Grumbling, RN

## 2020-09-30 NOTE — Patient Instructions (Signed)
DUE TO COVID-19 ONLY ONE VISITOR IS ALLOWED TO COME WITH YOU AND STAY IN THE WAITING ROOM ONLY DURING PRE OP AND PROCEDURE DAY OF SURGERY. THE 1 VISITOR  MAY VISIT WITH YOU AFTER SURGERY IN YOUR PRIVATE ROOM DURING VISITING HOURS ONLY!               Jeffrey Rangel   Your procedure is scheduled on: 10/13/20   Report to [facility] Main  Entrance   Report to short stay at: 5:30 AM     Call this number if you have problems the morning of surgery [phone]    Remember: Do not eat food or drink liquids :After Midnight.   BRUSH YOUR TEETH MORNING OF SURGERY AND RINSE YOUR MOUTH OUT, NO CHEWING GUM CANDY OR MINTS.                You may not have any metal on your body including hair pins and              piercings  Do not wear jewelry, lotions, powders or perfumes, deodorant             Men may shave face and neck.   Do not bring valuables to the hospital. [place].  Contacts, dentures or bridgework may not be worn into surgery.  Leave suitcase in the car. After surgery it may be brought to your room.     Patients discharged the day of surgery will not be allowed to drive home. IF YOU ARE HAVING SURGERY AND GOING HOME THE SAME DAY, YOU MUST HAVE AN ADULT TO DRIVE YOU HOME AND BE WITH YOU FOR 24 HOURS. YOU MAY GO HOME BY TAXI OR UBER OR ORTHERWISE, BUT AN ADULT MUST ACCOMPANY YOU HOME AND STAY WITH YOU FOR 24 HOURS.  Name and phone number of your driver:  Special Instructions: N/A              Please read over the following fact sheets you were given: _____________________________________________________________________         [facility] - Preparing for Surgery Before surgery, you can play an important role.  Because skin is not sterile, your skin needs to be as free of germs as possible.  You can reduce the number of germs on your skin by washing with CHG (chlorahexidine gluconate) soap before surgery.  CHG is an  antiseptic cleaner which kills germs and bonds with the skin to continue killing germs even after washing. Please DO NOT use if you have an allergy to CHG or antibacterial soaps.  If your skin becomes reddened/irritated stop using the CHG and inform your nurse when you arrive at Short Stay. Do not shave (including legs and underarms) for at least 48 hours prior to the first CHG shower.  You may shave your face/neck. Please follow these instructions carefully:  1.  Shower with CHG Soap the night before surgery and the  morning of Surgery.  2.  If you choose to wash your hair, wash your hair first as usual with your  normal  shampoo.  3.  After you shampoo, rinse your hair and body thoroughly to remove the  shampoo.                           4.  Use CHG as you would  any other liquid soap.  You can apply chg directly  to the skin and wash                       Gently with a scrungie or clean washcloth.  5.  Apply the CHG Soap to your body ONLY FROM THE NECK DOWN.   Do not use on face/ open                           Wound or open sores. Avoid contact with eyes, ears mouth and genitals (private parts).                       Wash face,  Genitals (private parts) with your normal soap.             6.  Wash thoroughly, paying special attention to the area where your surgery  will be performed.  7.  Thoroughly rinse your body with warm water from the neck down.  8.  DO NOT shower/wash with your normal soap after using and rinsing off  the CHG Soap.                9.  Pat yourself dry with a clean towel.            10.  Wear clean pajamas.            11.  Place clean sheets on your bed the night of your first shower and do not  sleep with pets. Day of Surgery : Do not apply any lotions/deodorants the morning of surgery.  Please wear clean clothes to the hospital/surgery center.  FAILURE TO FOLLOW THESE INSTRUCTIONS MAY RESULT IN THE CANCELLATION OF YOUR SURGERY PATIENT  SIGNATURE_________________________________  NURSE SIGNATURE__________________________________  ________________________________________________________________________

## 2020-10-01 ENCOUNTER — Other Ambulatory Visit: Payer: Self-pay

## 2020-10-01 ENCOUNTER — Encounter (HOSPITAL_COMMUNITY): Payer: Self-pay

## 2020-10-01 ENCOUNTER — Encounter (HOSPITAL_COMMUNITY)
Admission: RE | Admit: 2020-10-01 | Discharge: 2020-10-01 | Disposition: A | Discharge status: Home / Self Care | Payer: 59 | Source: Ambulatory Visit | Attending: Surgery | Admitting: Surgery

## 2020-10-01 DIAGNOSIS — Z01812 Encounter for preprocedural laboratory examination: Secondary | ICD-10-CM | POA: Insufficient documentation

## 2020-10-01 LAB — CBC
HCT: 48.6 % (ref 39.0–52.0)
Hemoglobin: 16.2 g/dL (ref 13.0–17.0)
MCH: 29.6 pg (ref 26.0–34.0)
MCHC: 33.3 g/dL (ref 30.0–36.0)
MCV: 88.7 fL (ref 80.0–100.0)
Platelets: 163 10*3/uL (ref 150–400)
RBC: 5.48 MIL/uL (ref 4.22–5.81)
RDW: 14.1 % (ref 11.5–15.5)
WBC: 4.7 10*3/uL (ref 4.0–10.5)
nRBC: 0 % (ref 0.0–0.2)

## 2020-10-01 NOTE — Progress Notes (Addendum)
COVID Vaccine Completed: NO Date COVID Vaccine completed: COVID vaccine manufacturer: [employer]   PCP - Dr. Gladys Damme. LOV: 05/26/20 Cardiologist -   Chest x-ray -  EKG -  Stress Test -  ECHO -  Cardiac Cath -  Pacemaker/ICD device last checked:  Sleep Study -  CPAP -   Fasting Blood Sugar -  Checks Blood Sugar _____ times a day  Blood Thinner Instructions: Aspirin Instructions: Last Dose:  Anesthesia review:   Patient denies shortness of breath, fever, cough and chest pain at PAT appointment   Patient verbalized understanding of instructions that were given to them at the PAT appointment. Patient was also instructed that they will need to review over the PAT instructions again at home before surgery.

## 2020-10-11 ENCOUNTER — Encounter (HOSPITAL_COMMUNITY): Payer: Self-pay | Admitting: Surgery

## 2020-10-12 NOTE — Anesthesia Preprocedure Evaluation (Addendum)
Anesthesia Evaluation  Patient identified by MRN, date of birth, ID band Patient awake    Airway Mallampati: II  TM Distance: >3 FB     Dental  (+)    Pulmonary neg pulmonary ROS, Patient abstained from smoking., former smoker,    breath sounds clear to auscultation       Cardiovascular negative cardio ROS   Rhythm:Regular Rate:Normal     Neuro/Psych    GI/Hepatic Neg liver ROS, GERD  ,  Endo/Other  negative endocrine ROS  Renal/GU negative Renal ROS     Musculoskeletal   Abdominal   Peds  Hematology   Anesthesia Other Findings   Reproductive/Obstetrics                           Anesthesia Physical Anesthesia Plan  ASA: III  Anesthesia Plan: General   Post-op Pain Management:    Induction: Intravenous  PONV Risk Score and Plan: Ondansetron, Dexamethasone and Midazolam  Airway Management Planned: LMA  Additional Equipment:   Intra-op Plan:   Post-operative Plan: Extubation in OR  Informed Consent: I have reviewed the patients History and Physical, chart, labs and discussed the procedure including the risks, benefits and alternatives for the proposed anesthesia with the patient or authorized representative who has indicated his/her understanding and acceptance.     Dental advisory given  Plan Discussed with: Anesthesiologist  Anesthesia Plan Comments:        Anesthesia Quick Evaluation

## 2020-10-13 ENCOUNTER — Encounter (HOSPITAL_COMMUNITY): Payer: Self-pay | Admitting: Surgery

## 2020-10-13 ENCOUNTER — Ambulatory Visit (HOSPITAL_COMMUNITY): Payer: 59 | Admitting: Anesthesiology

## 2020-10-13 ENCOUNTER — Encounter (HOSPITAL_COMMUNITY)
Admission: RE | Disposition: A | Discharge status: Home / Self Care | Payer: Self-pay | Source: Other Acute Inpatient Hospital | Attending: Surgery

## 2020-10-13 ENCOUNTER — Ambulatory Visit (HOSPITAL_COMMUNITY)
Admission: RE | Admit: 2020-10-13 | Discharge: 2020-10-13 | Disposition: A | Discharge status: Home / Self Care | Payer: 59 | Source: Other Acute Inpatient Hospital | Attending: Surgery | Admitting: Surgery

## 2020-10-13 DIAGNOSIS — Z87891 Personal history of nicotine dependence: Secondary | ICD-10-CM | POA: Diagnosis not present

## 2020-10-13 DIAGNOSIS — Z8711 Personal history of peptic ulcer disease: Secondary | ICD-10-CM | POA: Insufficient documentation

## 2020-10-13 DIAGNOSIS — K429 Umbilical hernia without obstruction or gangrene: Secondary | ICD-10-CM | POA: Diagnosis present

## 2020-10-13 DIAGNOSIS — K432 Incisional hernia without obstruction or gangrene: Secondary | ICD-10-CM | POA: Diagnosis not present

## 2020-10-13 HISTORY — PX: VENTRAL HERNIA REPAIR: SHX424

## 2020-10-13 HISTORY — PX: UMBILICAL HERNIA REPAIR: SHX196

## 2020-10-13 SURGERY — REPAIR, HERNIA, UMBILICAL, ADULT
Anesthesia: General | Site: Abdomen

## 2020-10-13 MED ORDER — MIDAZOLAM HCL 5 MG/5ML IJ SOLN
INTRAMUSCULAR | Status: DC | PRN
  Administered 2020-10-13: 2 mg via INTRAVENOUS

## 2020-10-13 MED ORDER — DEXAMETHASONE SODIUM PHOSPHATE 10 MG/ML IJ SOLN
INTRAMUSCULAR | Status: AC
  Filled 2020-10-13: qty 1

## 2020-10-13 MED ORDER — CEFAZOLIN SODIUM-DEXTROSE 2-4 GM/100ML-% IV SOLN
INTRAVENOUS | Status: AC
  Filled 2020-10-13: qty 100

## 2020-10-13 MED ORDER — 0.9 % SODIUM CHLORIDE (POUR BTL) OPTIME
TOPICAL | Status: DC | PRN
  Administered 2020-10-13: 1000 mL

## 2020-10-13 MED ORDER — HYDROMORPHONE HCL 1 MG/ML IJ SOLN
INTRAMUSCULAR | Status: AC
  Administered 2020-10-13: 0.25 mg via INTRAVENOUS
  Filled 2020-10-13: qty 1

## 2020-10-13 MED ORDER — FENTANYL CITRATE (PF) 100 MCG/2ML IJ SOLN
INTRAMUSCULAR | Status: DC | PRN
  Administered 2020-10-13: 25 ug via INTRAVENOUS
  Administered 2020-10-13: 50 ug via INTRAVENOUS
  Administered 2020-10-13: 25 ug via INTRAVENOUS

## 2020-10-13 MED ORDER — CHLORHEXIDINE GLUCONATE CLOTH 2 % EX PADS: 6.0000 | MEDICATED_PAD | Freq: Once | CUTANEOUS | Status: DC

## 2020-10-13 MED ORDER — ONDANSETRON HCL 4 MG/2ML IJ SOLN
INTRAMUSCULAR | Status: DC | PRN
  Administered 2020-10-13: 4 mg via INTRAVENOUS

## 2020-10-13 MED ORDER — HYDROMORPHONE HCL 1 MG/ML IJ SOLN
0.2500 mg | INTRAMUSCULAR | Status: DC | PRN
  Administered 2020-10-13 (×3): 0.25 mg via INTRAVENOUS

## 2020-10-13 MED ORDER — CHLORHEXIDINE GLUCONATE 0.12 % MT SOLN
15.0000 mL | Freq: Once | OROMUCOSAL | Status: AC
  Administered 2020-10-13: 15 mL via OROMUCOSAL

## 2020-10-13 MED ORDER — ONDANSETRON HCL 4 MG/2ML IJ SOLN
INTRAMUSCULAR | Status: AC
  Filled 2020-10-13: qty 2

## 2020-10-13 MED ORDER — LIDOCAINE HCL (PF) 2 % IJ SOLN
INTRAMUSCULAR | Status: AC
  Filled 2020-10-13: qty 5

## 2020-10-13 MED ORDER — DEXMEDETOMIDINE (PRECEDEX) IN NS 20 MCG/5ML (4 MCG/ML) IV SYRINGE
PREFILLED_SYRINGE | INTRAVENOUS | Status: AC
  Filled 2020-10-13: qty 5

## 2020-10-13 MED ORDER — CEFAZOLIN SODIUM-DEXTROSE 2-4 GM/100ML-% IV SOLN
2.0000 g | INTRAVENOUS | Status: AC
  Administered 2020-10-13: 2 g via INTRAVENOUS

## 2020-10-13 MED ORDER — OXYCODONE HCL 5 MG PO TABS: 5.0000 mg | ORAL_TABLET | ORAL | 0 refills | Status: DC | PRN

## 2020-10-13 MED ORDER — BUPIVACAINE HCL 0.25 % IJ SOLN
INTRAMUSCULAR | Status: AC
  Filled 2020-10-13: qty 1

## 2020-10-13 MED ORDER — LIDOCAINE 2% (20 MG/ML) 5 ML SYRINGE
INTRAMUSCULAR | Status: DC | PRN
  Administered 2020-10-13: 80 mg via INTRAVENOUS

## 2020-10-13 MED ORDER — FENTANYL CITRATE (PF) 250 MCG/5ML IJ SOLN
INTRAMUSCULAR | Status: AC
  Filled 2020-10-13: qty 5

## 2020-10-13 MED ORDER — DEXAMETHASONE SODIUM PHOSPHATE 4 MG/ML IJ SOLN
INTRAMUSCULAR | Status: DC | PRN
  Administered 2020-10-13: 10 mg via INTRAVENOUS

## 2020-10-13 MED ORDER — PROPOFOL 10 MG/ML IV BOLUS
INTRAVENOUS | Status: DC | PRN
  Administered 2020-10-13: 200 mg via INTRAVENOUS

## 2020-10-13 MED ORDER — MIDAZOLAM HCL 2 MG/2ML IJ SOLN
INTRAMUSCULAR | Status: AC
  Filled 2020-10-13: qty 2

## 2020-10-13 MED ORDER — LACTATED RINGERS IV SOLN: INTRAVENOUS | Status: DC

## 2020-10-13 MED ORDER — PROPOFOL 10 MG/ML IV BOLUS
INTRAVENOUS | Status: AC
  Filled 2020-10-13: qty 40

## 2020-10-13 MED ORDER — DEXMEDETOMIDINE (PRECEDEX) IN NS 20 MCG/5ML (4 MCG/ML) IV SYRINGE
PREFILLED_SYRINGE | INTRAVENOUS | Status: DC | PRN
  Administered 2020-10-13 (×2): 4 ug via INTRAVENOUS

## 2020-10-13 MED ORDER — BUPIVACAINE HCL 0.25 % IJ SOLN
INTRAMUSCULAR | Status: DC | PRN
  Administered 2020-10-13: 50 mL

## 2020-10-13 MED ORDER — ORAL CARE MOUTH RINSE: 15.0000 mL | Freq: Once | OROMUCOSAL | Status: AC

## 2020-10-13 SURGICAL SUPPLY — 32 items
BINDER ABDOMINAL 12 ML 46-62 (SOFTGOODS) IMPLANT
BLADE HEX COATED 2.75 (ELECTRODE) IMPLANT
CHLORAPREP W/TINT 26 (MISCELLANEOUS) ×2 IMPLANT
COVER SURGICAL LIGHT HANDLE (MISCELLANEOUS) ×2 IMPLANT
COVER WAND RF STERILE (DRAPES) IMPLANT
DECANTER SPIKE VIAL GLASS SM (MISCELLANEOUS) ×2 IMPLANT
DERMABOND ADVANCED (GAUZE/BANDAGES/DRESSINGS) ×1
DERMABOND ADVANCED .7 DNX12 (GAUZE/BANDAGES/DRESSINGS) ×1 IMPLANT
DRAPE LAPAROSCOPIC ABDOMINAL (DRAPES) ×2 IMPLANT
ELECT REM PT RETURN 15FT ADLT (MISCELLANEOUS) ×2 IMPLANT
GAUZE SPONGE 4X4 12PLY STRL (GAUZE/BANDAGES/DRESSINGS) ×2 IMPLANT
GLOVE SURG ORTHO LTX SZ8 (GLOVE) ×2 IMPLANT
GOWN STRL REUS W/TWL XL LVL3 (GOWN DISPOSABLE) ×4 IMPLANT
KIT BASIN OR (CUSTOM PROCEDURE TRAY) ×2 IMPLANT
KIT TURNOVER KIT A (KITS) ×2 IMPLANT
MARKER SKIN DUAL TIP RULER LAB (MISCELLANEOUS) ×2 IMPLANT
MESH VENTRALEX ST 1-7/10 CRC S (Mesh General) ×4 IMPLANT
NEEDLE HYPO 22GX1.5 SAFETY (NEEDLE) ×2 IMPLANT
PACK GENERAL/GYN (CUSTOM PROCEDURE TRAY) ×2 IMPLANT
PENCIL SMOKE EVACUATOR (MISCELLANEOUS) IMPLANT
STAPLER VISISTAT 35W (STAPLE) ×2 IMPLANT
STRIP CLOSURE SKIN 1/2X4 (GAUZE/BANDAGES/DRESSINGS) IMPLANT
SUT MNCRL AB 4-0 PS2 18 (SUTURE) ×4 IMPLANT
SUT NOVA 1 T20/GS 25DT (SUTURE) IMPLANT
SUT NOVA NAB GS-21 0 18 T12 DT (SUTURE) ×4 IMPLANT
SUT PDS AB 1 CTX 36 (SUTURE) IMPLANT
SUT VIC AB 2-0 CT2 27 (SUTURE) IMPLANT
SUT VIC AB 3-0 SH 18 (SUTURE) ×4 IMPLANT
SYR 20ML LL LF (SYRINGE) ×2 IMPLANT
TOWEL OR 17X26 10 PK STRL BLUE (TOWEL DISPOSABLE) ×2 IMPLANT
TOWEL OR NON WOVEN STRL DISP B (DISPOSABLE) ×2 IMPLANT
TRAY FOLEY MTR SLVR 16FR STAT (SET/KITS/TRAYS/PACK) IMPLANT

## 2020-10-13 NOTE — Anesthesia Postprocedure Evaluation (Signed)
Anesthesia Post Note  Patient: Marine scientist J Henriquez  Procedure(s) Performed: OPEN HERNIA REPAIR UMBILICAL ADULT WITH MESH PATCH (N/A Abdomen) OPEN REPAIR RECURRENT HERNIA REPAIR VENTRAL ADULT WITH MESH PATCH (N/A Abdomen)     Patient location during evaluation: PACU Anesthesia Type: General Level of consciousness: awake Pain management: pain level controlled Vital Signs Assessment: post-procedure vital signs reviewed and stable Respiratory status: spontaneous breathing Cardiovascular status: stable Postop Assessment: no apparent nausea or vomiting Anesthetic complications: no   No complications documented.  Last Vitals:  Vitals:   10/13/20 0900 10/13/20 0905  BP: 122/88   Pulse: 64 60  Resp: 13 12  Temp:    SpO2: 100% 100%    Last Pain:  Vitals:   10/13/20 0910  TempSrc:   PainSc: 8                  Israel Werts

## 2020-10-13 NOTE — Op Note (Signed)
Operative Note  Pre-operative Diagnosis:  1. Umbilical hernia  2. Incisional hernia  Post-operative Diagnosis:  same  Surgeon:  Armandina Gemma, MD  Assistant:  none   Procedure:  1. Open repair umbilical hernia with mesh patch  2. Open repair incisional hernia with mesh patch  Anesthesia:  general  Estimated Blood Loss:  minimal  Drains: none         Specimen: none  Indications:  Patient is self-referred. Patient has had 2 prior surgical procedures for small ventral hernias several years ago. Patient has developed pain at the site of the inferior surgical incision in the midline. He has noted a bulge which has required manual reduction. It is tender to palpation. He presents today for evaluation. He is also had a long-standing umbilical hernia which is been largely asymptomatic but is tender to palpation. He has had no signs or symptoms of intestinal obstruction. He is had no other significant abdominal surgery. Patient was evaluated in July 2020. He was scheduled for surgical repair. Unfortunately, due to financial concerns, he was unable to proceed with surgery at that time. Patient states that the symptoms from his hernias have become worse. He returns today in hopes of proceeding with surgery in the near future.   Procedure:  The patient was seen in the pre-op holding area. The risks, benefits, complications, treatment options, and expected outcomes were previously discussed with the patient. The patient agreed with the proposed plan and has signed the informed consent form.  The patient was brought to the operating room by the surgical team, identified as Cando and the procedure verified. A "time out" was completed and the above information confirmed.  Following induction of general anesthesia, the patient is positioned and then prepped and draped in the usual aseptic fashion.  After ascertaining that an adequate level of anesthesia had been achieved, an incision  is made transversely beneath the umbilicus.  Dissection is carried through subcutaneous tissues to the fascia.  Fascial plane is developed.  Umbilicus is completely freed off of the underlying fascia opening a small hernia sac.  The fascial plane is developed circumferentially around the defect.  The defect is quite small measuring approximately 1 cm in diameter.  Palpation through the defect reveals no other significant defects in the anterior abdominal wall.  A Bard Ventralex ST 4.3 cm mesh patch is selected.  It is prepared and inserted through the fascial defect and deployed circumferentially.  Defect is then closed with interrupted 0 Novafil simple sutures incorporating the mesh into the closure.  Local anesthetic is infiltrated circumferentially.  Umbilicus is affixed to the fascia with an interrupted 0 Novafil suture.  Subcutaneous tissues are closed with interrupted 3-0 Vicryl sutures.  Skin is anesthetized with local anesthetic.  Skin is closed with a running 4-0 Monocryl subcuticular suture.  Next the previous incision in the midline oriented transversely approximately 5 cm above the umbilicus is reopened with a #15 blade.  It is extended slightly in each direction.  Dissection was carried into the subcutaneous tissues.  Scar tissue from previous surgery is identified.  Dissection is carried down to the fascia.  Fascial plane is developed.  There appears to be a small approximately 3 to 4 mm defect in the midline of the fascia.  This is dissected out and the preperitoneal space is entered.  Fascial edges are freshened circumferentially.  Preperitoneal space is developed with blunt dissection with the finger.  Palpation of the abdominal wall circumferentially shows no other  evidence of fascial defects.  A Bard Ventralex ST 4.3 cm mesh patch is prepared and again inserted into the preperitoneal space.  It is deployed circumferentially.  The fascial defect is closed with interrupted 0 Novafil simple  sutures incorporating the mesh into the closure.  Local anesthetic is infiltrated circumferentially.  Subcutaneous tissues are closed with interrupted 3-0 Vicryl sutures.  Skin is anesthetized with local anesthetic.  Skin edges are reapproximated with a running 4-0 Monocryl subcuticular suture.  Wounds are washed and dried.  Dermabond is applied as dressing.  Patient is awakened from anesthesia and transported to the recovery room.  The patient tolerated the procedure well.   Armandina Gemma, MD [facility] Office: [phone]

## 2020-10-13 NOTE — Transfer of Care (Signed)
Immediate Anesthesia Transfer of Care Note  Patient: Jeffrey Rangel  Procedure(s) Performed: Procedure(s): OPEN HERNIA REPAIR UMBILICAL ADULT WITH MESH PATCH (N/A) OPEN REPAIR RECURRENT HERNIA REPAIR VENTRAL ADULT WITH MESH PATCH (N/A)  Patient Location: PACU  Anesthesia Type:General  Level of Consciousness:  sedated, patient cooperative and responds to stimulation  Airway & Oxygen Therapy:Patient Spontanous Breathing and Patient connected to face mask oxgen  Post-op Assessment:  Report given to PACU RN and Post -op Vital signs reviewed and stable  Post vital signs:  Reviewed and stable  Last Vitals:  Vitals:   10/13/20 0542 10/13/20 0845  BP: 137/87 107/67  Pulse: 67 (!) 57  Resp: 16 15  Temp: 36.7 C (!) 36.3 C  SpO2: 485% 927%    Complications: No apparent anesthesia complications

## 2020-10-13 NOTE — Interval H&P Note (Signed)
History and Physical Interval Note:  10/13/2020 7:06 AM  Jeffrey Rangel  has presented today for surgery, with the diagnosis of UMBILICAL HERNIA, RECURRENT VENTRAL HERNIA.  The various methods of treatment have been discussed with the patient and family. After consideration of risks, benefits and other options for treatment, the patient has consented to    Procedure(s): OPEN HERNIA REPAIR UMBILICAL ADULT WITH MESH PATCH (N/A) OPEN REPAIR RECURRENT HERNIA REPAIR VENTRAL ADULT WITH MESH PATCH (N/A) as a surgical intervention.    The patient's history has been reviewed, patient examined, no change in status, stable for surgery.  I have reviewed the patient's chart and labs.  Questions were answered to the patient's satisfaction.    Armandina Gemma, MD [facility] Office: [place]

## 2020-10-13 NOTE — Discharge Instructions (Addendum)
[facility]  HERNIA REPAIR POST OP INSTRUCTIONS  Always review your discharge instruction sheet given to you by the facility where your surgery was performed.  1. A  prescription for pain medication may be given to you upon discharge.  Take your pain medication as prescribed.  If narcotic pain medicine is not needed, then you may take acetaminophen (Tylenol) or ibuprofen (Advil) as needed.  2. Take your usually prescribed medications unless otherwise directed.  3. If you need a refill on your pain medication, please contact your pharmacy.  They will contact our office to request authorization. Prescriptions will not be filled after 5 pm daily or on weekends.  4. You should follow a light diet the first 24 hours after arrival home, such as soup and crackers or toast.  Be sure to include plenty of fluids daily.  Resume your normal diet the day after surgery.  5. Most patients will experience some swelling and bruising around the surgical site.  Ice packs and reclining will help.  Swelling and bruising can take several days to resolve.   6. It is common to experience some constipation if taking pain medication after surgery.  Increasing fluid intake and taking a stool softener (such as Colace) will usually help or prevent this problem from occurring.  A mild laxative (Milk of Magnesia or Miralax) should be taken according to package directions if there are no bowel movements after 48 hours.  7. You will likely have Dermabond (topical glue) over your incisions.  This seals the incisions and allows you to bathe and shower at any time after your surgery.  Glue should remain in place for up to 10 days.  It may be removed after 10 days by pealing off the Dermabond material or using Vaseline or naval jelly to remove.  8. If you have steri-strips over your incisions, you may remove the gauze bandage on the second day after surgery, and you may shower at that time.  Leave your  steri-strips (small skin tapes) in place directly over the incision.  These strips should remain on the skin for 5-7 days and then be removed.  You may get them wet in the shower and pat them dry.  9. ACTIVITIES:  You may resume regular (light) daily activities beginning the next day - such as daily self-care, walking, climbing stairs - gradually increasing activities as tolerated.  You may have sexual intercourse when it is comfortable.  Refrain from any heavy lifting or straining until approved by your doctor.  You may drive when you are no longer taking prescription pain medication, when you can comfortably wear a seatbelt, and when you can safely maneuver your car and apply brakes.  10. You should see your doctor in the office for a follow-up appointment approximately 2-3 weeks after your surgery.  Make sure that you call for this appointment within a day or two after you arrive home to insure a convenient appointment time.  WHEN TO CALL YOUR DOCTOR: 1. Fever greater than 101.0 2. Inability to urinate 3. Persistent nausea and/or vomiting 4. Extreme swelling or bruising 5. Continued bleeding from incision 6. Increased pain, redness, or drainage from the incision  The clinic staff is available to answer your questions during regular business hours.  Please don't hesitate to call and ask to speak to one of the nurses for clinical concerns.  If you have a medical emergency, go to the nearest emergency room or call 911.  A surgeon from [employer]  [facility] is always on call for the hospital.   [facility] [address] [place], [place], [place]  [address]  [phone] ? [phone] ? FAX [phone]  [email or web address]

## 2020-10-13 NOTE — Anesthesia Procedure Notes (Signed)
Procedure Name: LMA Insertion Date/Time: 10/13/2020 7:35 AM Performed by: Lavina Hamman, CRNA Pre-anesthesia Checklist: Patient identified, Emergency Drugs available, Suction available and Patient being monitored Patient Re-evaluated:Patient Re-evaluated prior to induction Oxygen Delivery Method: Circle System Utilized Preoxygenation: Pre-oxygenation with 100% oxygen Induction Type: IV induction Ventilation: Mask ventilation without difficulty LMA: LMA inserted LMA Size: 5.0 Number of attempts: 1 Airway Equipment and Method: Bite block Placement Confirmation: positive ETCO2 Tube secured with: Tape Dental Injury: Teeth and Oropharynx as per pre-operative assessment  Comments: AT  LMA insertion, teeth unchanged.

## 2020-10-14 ENCOUNTER — Encounter (HOSPITAL_COMMUNITY): Payer: Self-pay | Admitting: Surgery

## 2021-03-13 IMAGING — CT CT ABD-PELV W/ CM
2 of 4 series · 16 of 46 positions shown, 18 images · IV contrast (Omni 300)
Comparison: None.

CLINICAL DATA: 24-year-old male with acute abdominal pain and
vomiting. History of prior umbilical hernia repair.

EXAM:
CT ABDOMEN AND PELVIS WITH CONTRAST
TECHNIQUE: Multidetector CT imaging of the abdomen and pelvis was performed
using the standard protocol following bolus administration of
intravenous contrast.
CONTRAST:  100mL OMNIPAQUE IOHEXOL 300 MG/ML  SOLN

[Series 3: a/p w/ 5mm · axial · 0.80mm/px · z∈[+840,+1270]mm · 13 of 94 slices shown, 15 images]
[im 4/94  soft-tissue]
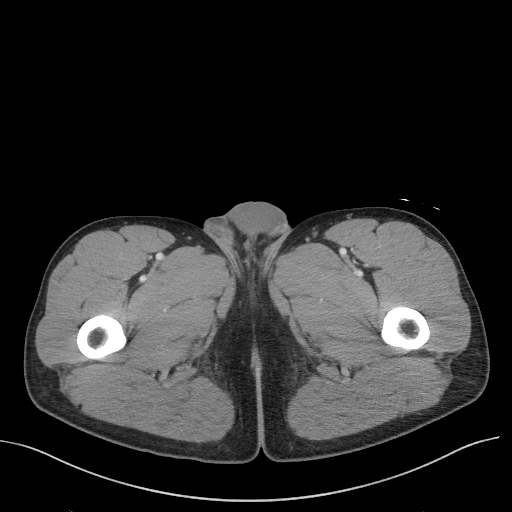
[im 4/94  bone]
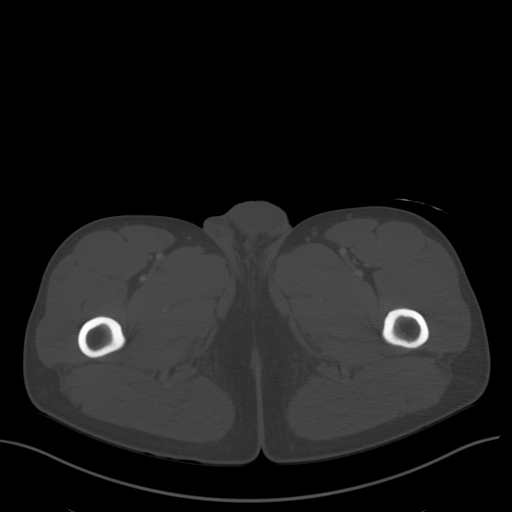
[im 12/94  soft-tissue]
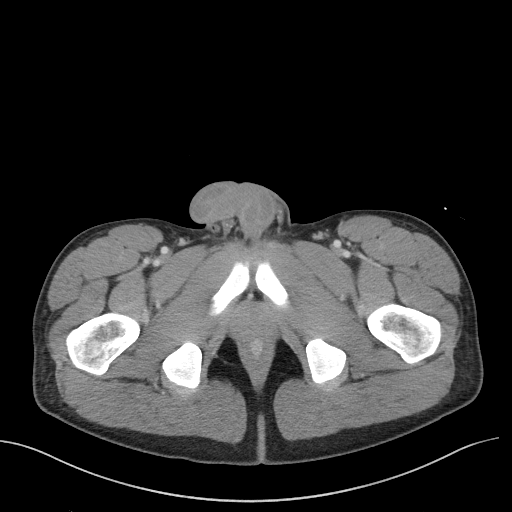
[im 20/94  soft-tissue]
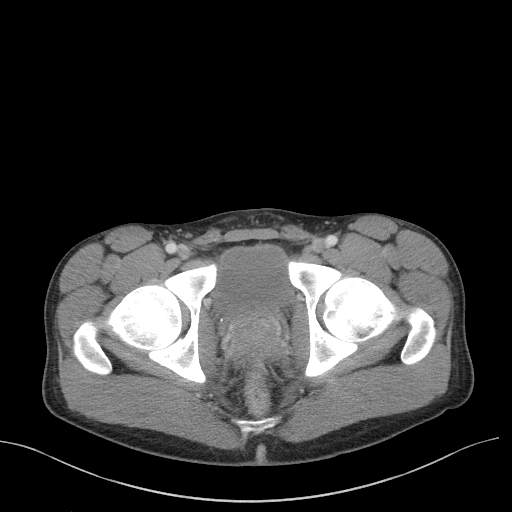
[im 28/94  soft-tissue]
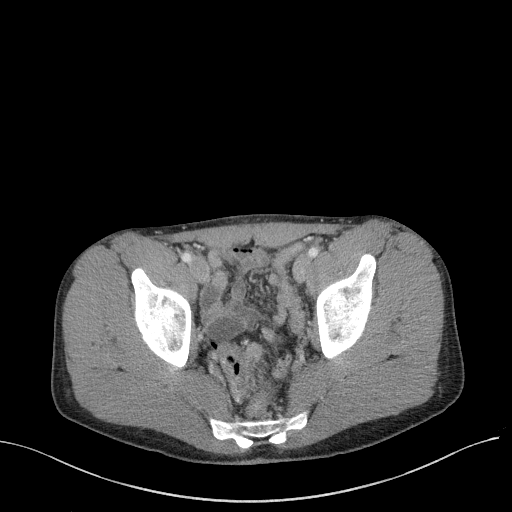
[im 32/94  soft-tissue]
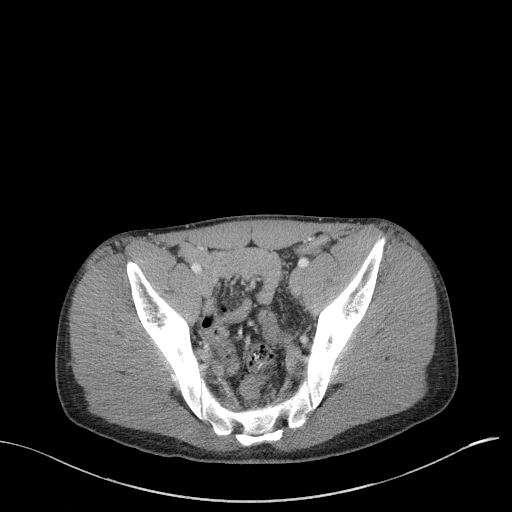
[im 39/94  soft-tissue]
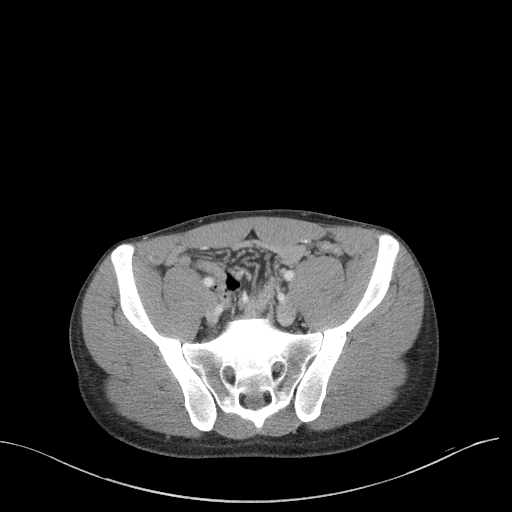
[im 47/94  soft-tissue]
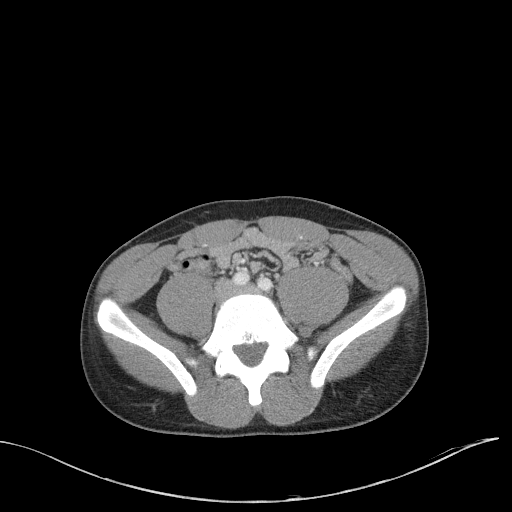
[im 55/94  soft-tissue]
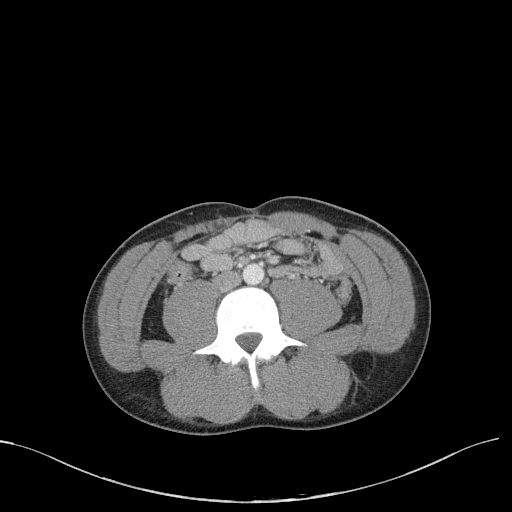
[im 63/94  soft-tissue]
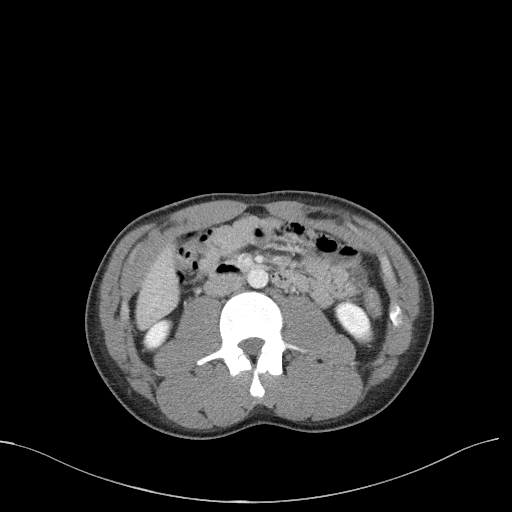
[im 63/94  bone]
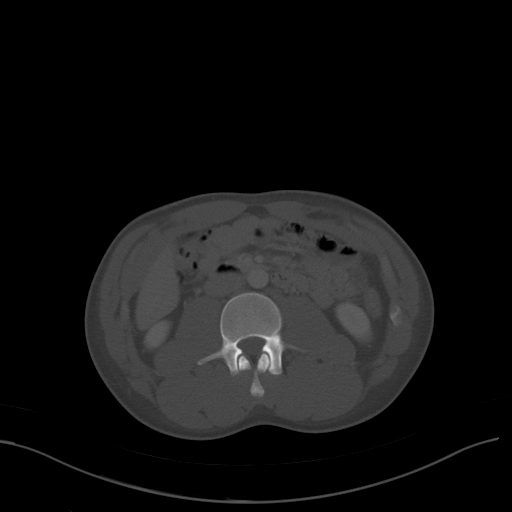
[im 66/94  soft-tissue]
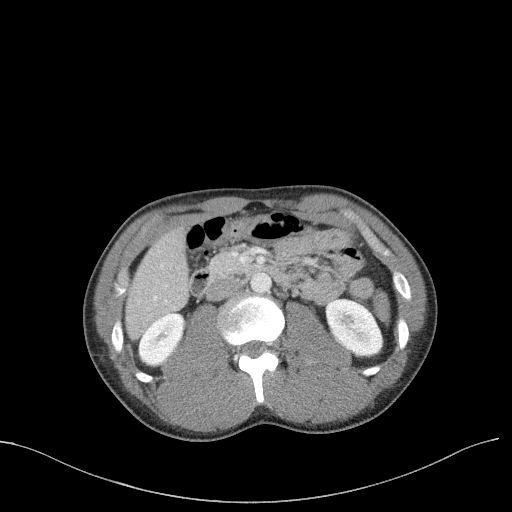
[im 74/94  soft-tissue]
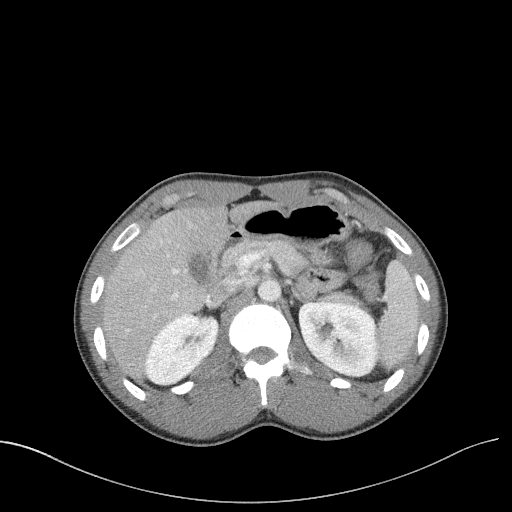
[im 82/94  soft-tissue]
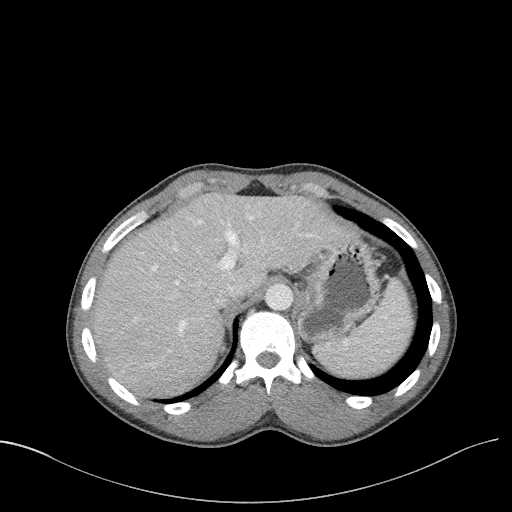
[im 90/94  soft-tissue]
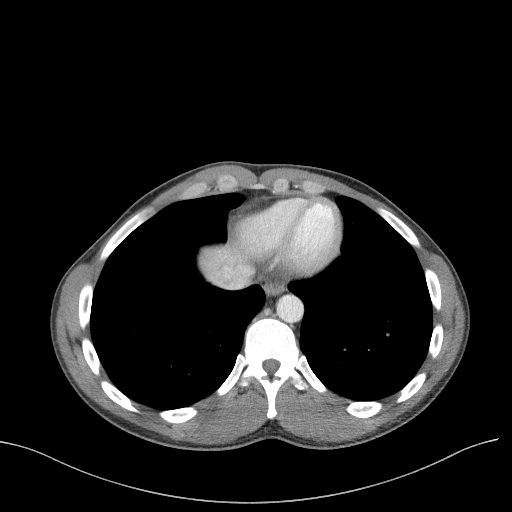

[Series 6: a/p w/ cor · coronal · 0.87mm/px · 3 of 150 slices shown]
[im 50/150  soft-tissue]
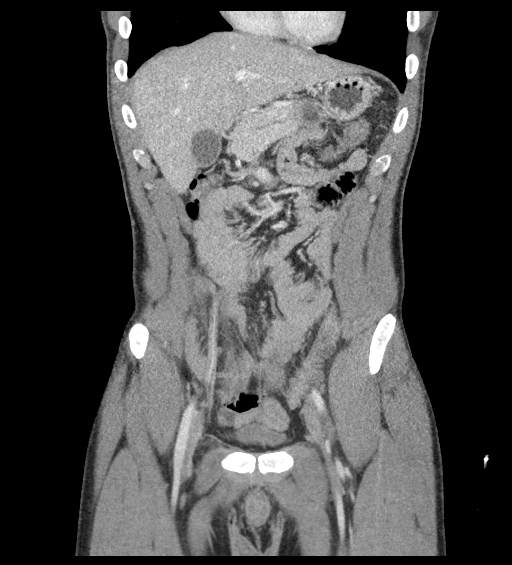
[im 67/150  soft-tissue]
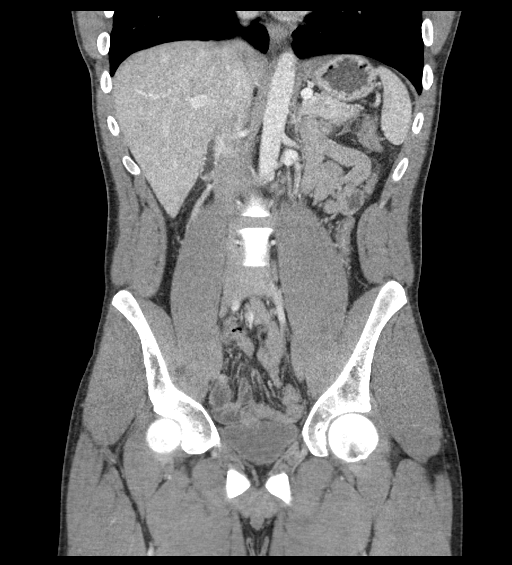
[im 83/150  soft-tissue]
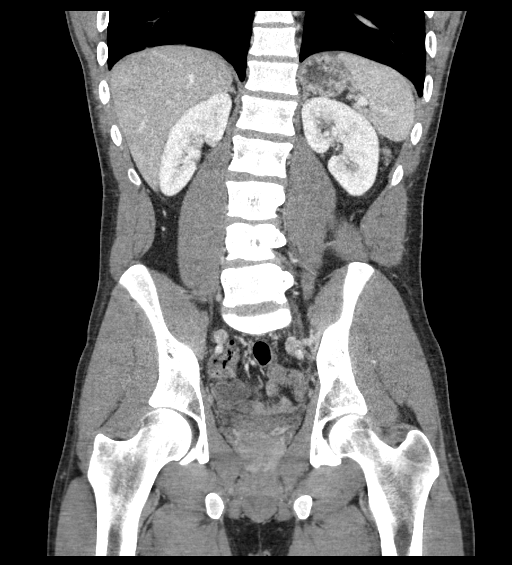

[16 of 46 positions shown; findings below may reference images not displayed]

FINDINGS: Lower chest: Unremarkable

Hepatobiliary: The liver and gallbladder are unremarkable. No
biliary dilatation.

Pancreas: Unremarkable

Spleen: Unremarkable

Adrenals/Urinary Tract: A punctate calcification is noted in the
region of the RIGHT UVJ (series 3: Image 75), but there is no
evidence of hydroureter or hydronephrosis. This more likely
represents a non-urinary calcification rather than a nonobstructing
UVJ calculus, but correlate clinically.

A punctate nonobstructing RIGHT LOWER pole renal calculus is
present.

No other renal abnormalities are identified. The adrenal glands and
bladder are otherwise unremarkable.

Stomach/Bowel: Stomach is within normal limits. Appendix appears
normal. No evidence of bowel wall thickening, distention, or
inflammatory changes.

Vascular/Lymphatic: No significant vascular findings are present. No
enlarged abdominal or pelvic lymph nodes.

Reproductive: Prostate is unremarkable.

Other: No abdominal wall hernia or abnormality. No abdominopelvic
ascites.

Musculoskeletal: No acute or significant osseous findings.
IMPRESSION: 1. Punctate calcification in the region of the RIGHT UVJ without
hydroureter or hydronephrosis. This more likely represents a
non-urinary calcification rather than a nonobstructing UVJ calculus,
but correlate clinically.
2. Punctate nonobstructing RIGHT LOWER pole renal calculus.
3. No other significant abnormalities. No evidence of abdominal wall
hernia.

## 2021-07-13 ENCOUNTER — Encounter (HOSPITAL_COMMUNITY): Payer: Self-pay | Admitting: *Deleted

## 2021-07-13 ENCOUNTER — Ambulatory Visit (INDEPENDENT_AMBULATORY_CARE_PROVIDER_SITE_OTHER): Payer: 59

## 2021-07-13 ENCOUNTER — Ambulatory Visit (HOSPITAL_COMMUNITY)
Admission: EM | Admit: 2021-07-13 | Discharge: 2021-07-13 | Disposition: A | Discharge status: Home / Self Care | Payer: 59 | Attending: Family Medicine | Admitting: Family Medicine

## 2021-07-13 ENCOUNTER — Other Ambulatory Visit: Payer: Self-pay

## 2021-07-13 DIAGNOSIS — M79644 Pain in right finger(s): Secondary | ICD-10-CM

## 2021-07-13 NOTE — Discharge Instructions (Addendum)
Your x-ray today did not show injury to the bone, ligaments or tendons of your finger. Your pain is most likely being caused by irritation to the soft tissues, this should improve as time progresses.   You may apply heat or ice, whichever makes you feel better, to affected area in 15 minute intervals  You may continue activity as tolerated, there is no injury therefore, it is important that you continue to move around so you do not loose strength to the area  For the first 2-3 days you may wrap your middle finger and ringer finger together for additional support while completing activities, once wrapped if you begin to experience numbness or tingling it is too tight, remove and redo, you should be able to easily fit one finger under wrap   If symptoms persist past 2 weeks, you may follow up at urgent care or with orthopedic specialist for evaluation, an orthopedic doctor specializes in the bone, they may provide  management such as but not limited to imaging, long term medications and physical therapy

## 2021-07-13 NOTE — ED Provider Notes (Signed)
[place]    CSN: [id number] Arrival date & time: 07/13/21  0258      History   Chief Complaint Chief Complaint  Patient presents with   Finger Injury    RT middle    HPI Jeffrey Rangel is a 25 y.o. male.   Patient presents today with pain and swelling to his right middle finger for 1 day beginning after a piece of stage at work fell on to it.  There was a direct hit to the finger.  Range of motion is intact but elicits pain.  Endorses decreased sensation but not numbness.  Denies prior injury or trauma.  Has not attempted treatment.  Past Medical History:  Diagnosis Date   Bronchitis    hx of    Epigastric hernia 02/2014   GERD (gastroesophageal reflux disease)     Patient Active Problem List   Diagnosis Date Noted   Incisional hernia, without obstruction or gangrene 52/77/8242   Umbilical hernia 35/36/1443   Non-intractable vomiting    COVID    Chest pain 08/10/2017   Gastritis 08/10/2017   Constipation 15/40/0867   Supraumbilical hernia 61/95/0932   ECZEMA 03/04/2009   ALLERGIC RHINITIS 04/11/2007    Past Surgical History:  Procedure Laterality Date   EPIGASTRIC HERNIA REPAIR N/A 03/06/2014   Procedure: HERNIA REPAIR EPIGASTRIC PEDIATRIC X2;  Surgeon: Jerilynn Mages. Gerald Stabs, MD;  Location: [facility];  Service: Pediatrics;  Laterality: N/A;   SCROTAL EXPLORATION Bilateral 07/08/2013   Procedure: SCROTAL EXPLORATION WITH BILATERAL ORICHIOPEXY;  Surgeon: Franchot Gallo, MD;  Location: [facility];  Service: Urology;  Laterality: Bilateral;   UMBILICAL HERNIA REPAIR N/A 10/13/2020   Procedure: OPEN HERNIA REPAIR UMBILICAL ADULT WITH MESH PATCH;  Surgeon: Armandina Gemma, MD;  Location: [facility];  Service: General;  Laterality: N/A;   VENTRAL HERNIA REPAIR N/A 10/13/2020   Procedure: OPEN REPAIR RECURRENT HERNIA REPAIR VENTRAL ADULT WITH MESH PATCH;  Surgeon: Armandina Gemma, MD;  Location: [facility];  Service: General;  Laterality: N/A;       Home  Medications    Prior to Admission medications   Medication Sig Start Date End Date Taking? Authorizing Provider  pantoprazole (PROTONIX) 40 MG tablet Take 1 tablet (40 mg total) by mouth daily. 08/28/20   Petrucelli, Samantha R, PA-C  polyethylene glycol (MIRALAX) 17 g packet Take 17 g by mouth daily as needed for moderate constipation. 08/28/20   Petrucelli, Samantha R, PA-C  cetirizine (ZYRTEC) 10 MG tablet Take 10 mg by mouth daily.  07/05/19  [provider]  ipratropium (ATROVENT) 0.03 % nasal spray Place 2 sprays into both nostrils 3 (three) times daily as needed for rhinitis. 12/09/18 07/05/19  Scot Jun, FNP    Family History Family History  Problem Relation Age of Onset   Liver disease Father    Hypertension Mother    Asthma Mother    Asthma Brother    Diabetes Maternal Grandmother    Diabetes Maternal Grandfather     Social History Social History   Tobacco Use   Smoking status: Former   Smokeless tobacco: Former    Types: Nurse, children's Use: Never used  Substance Use Topics   Alcohol use: Yes    Comment: occas. beers   Drug use: No     Allergies   Other   Review of Systems Review of Systems  Constitutional: Negative.   Respiratory: Negative.    Cardiovascular: Negative.   Musculoskeletal:  Positive for joint swelling. Negative for arthralgias, back pain, gait problem, myalgias, neck pain and neck stiffness.  Skin: Negative.   Neurological: Negative.     Physical Exam Triage Vital Signs ED Triage Vitals  Enc Vitals Group     BP 07/13/21 0905 124/86     Pulse Rate 07/13/21 0905 (!) 59     Resp 07/13/21 0905 18     Temp 07/13/21 0905 98.2 F (36.8 C)     Temp src --      SpO2 07/13/21 0905 99 %     Weight --      Height --      Head Circumference --      Peak Flow --      Pain Score 07/13/21 0906 8     Pain Loc --      Pain Edu? --      Excl. in [place]? --    No data found.  Updated Vital Signs BP 124/86   Pulse (!)  59   Temp 98.2 F (36.8 C)   Resp 18   SpO2 99%   Visual Acuity Right Eye Distance:   Left Eye Distance:   Bilateral Distance:    Right Eye Near:   Left Eye Near:    Bilateral Near:     Physical Exam Constitutional:      Appearance: Normal appearance. He is normal weight.  HENT:     Head: Normocephalic.  Eyes:     Extraocular Movements: Extraocular movements intact.  Pulmonary:     Effort: Pulmonary effort is normal.  Musculoskeletal:     Comments: Point tenderness between the proximal and distal phalanx of the right middle finger, no ecchymosis noted, range of motion intact.  Refill less than 3, decreased sensation  Skin:    General: Skin is warm and dry.  Neurological:     Mental Status: He is alert and oriented to person, place, and time. Mental status is at baseline.  Psychiatric:        Mood and Affect: Mood normal.        Behavior: Behavior normal.     UC Treatments / Results  Labs (all labs ordered are listed, but only abnormal results are displayed) Labs Reviewed - No data to display  EKG   Radiology DG Finger Middle Right  Result Date: 07/13/2021 CLINICAL DATA:  Injury, pain and swelling EXAM: RIGHT MIDDLE FINGER 2+V COMPARISON:  None. FINDINGS: There is no evidence of fracture or dislocation. There is no evidence of arthropathy or other focal bone abnormality. Soft tissues are unremarkable. IMPRESSION: No fracture or dislocation of the right third digit. Electronically Signed   By: Delanna Ahmadi M.D.   On: 07/13/2021 09:25    Procedures Procedures (including critical care time)  Medications Ordered in UC Medications - No data to display  Initial Impression / Assessment and Plan / UC Course  I have reviewed the triage vital signs and the nursing notes.  Pertinent labs & imaging results that were available during my care of the patient were reviewed by me and considered in my medical decision making (see chart for details).  Finger pain,  right  1.  Right middle finger x-ray negative 2.  Advised use of heat or ice in 15-minute intervals, buddy taping for support as needed, over-the-counter medications for pain management 3.  Orthopedic follow-up for persistent pain, given walking referral 4.  Work note given for 3 days for light duty Final Clinical Impressions(s) / UC  Diagnoses   Final diagnoses:  Finger pain, right     Discharge Instructions      Your x-ray today did not show injury to the bone, ligaments or tendons of your finger. Your pain is most likely being caused by irritation to the soft tissues, this should improve as time progresses.   You may apply heat or ice, whichever makes you feel better, to affected area in 15 minute intervals  You may continue activity as tolerated, there is no injury therefore, it is important that you continue to move around so you do not loose strength to the area  For the first 2-3 days you may wrap your middle finger and ringer finger together for additional support while completing activities, once wrapped if you begin to experience numbness or tingling it is too tight, remove and redo, you should be able to easily fit one finger under wrap   If symptoms persist past 2 weeks, you may follow up at urgent care or with orthopedic specialist for evaluation, an orthopedic doctor specializes in the bone, they may provide  management such as but not limited to imaging, long term medications and physical therapy    ED Prescriptions   None    PDMP not reviewed this encounter.   Hans Eden, NP 07/13/21 1027

## 2021-07-13 NOTE — ED Triage Notes (Signed)
Pt reports he was putting together a stage at work Sunday and a part of stage fell on his finger.

## 2021-10-25 ENCOUNTER — Other Ambulatory Visit: Payer: Self-pay

## 2021-10-25 ENCOUNTER — Emergency Department (HOSPITAL_COMMUNITY)
Admission: EM | Admit: 2021-10-25 | Discharge: 2021-10-25 | Disposition: A | Discharge status: Home / Self Care | Payer: Medicaid Other | Attending: Emergency Medicine | Admitting: Emergency Medicine

## 2021-10-25 DIAGNOSIS — R600 Localized edema: Secondary | ICD-10-CM | POA: Insufficient documentation

## 2021-10-25 DIAGNOSIS — W4904XA Ring or other jewelry causing external constriction, initial encounter: Secondary | ICD-10-CM | POA: Insufficient documentation

## 2021-10-25 NOTE — ED Provider Notes (Cosign Needed)
[facility] EMERGENCY DEPARTMENT Provider Note   CSN: [id number] Arrival date & time: 10/25/21  2215     History  Chief Complaint  Patient presents with   ring stuck on finger    Jeffrey Rangel is a 26 y.o. male who presents to the ED today with complaint of stuck on left ring finger that occurred about 1.5 hours ago.  Patient states that he has tried removing it at home with palm oil and soap without relief.  Denies any tingling to his fingertip at this time.  No other complaints currently.   The history is provided by the patient and medical records.      Home Medications Prior to Admission medications   Medication Sig Start Date End Date Taking? Authorizing Provider  pantoprazole (PROTONIX) 40 MG tablet Take 1 tablet (40 mg total) by mouth daily. 08/28/20   Petrucelli, Samantha R, PA-C  polyethylene glycol (MIRALAX) 17 g packet Take 17 g by mouth daily as needed for moderate constipation. 08/28/20   Petrucelli, Samantha R, PA-C  cetirizine (ZYRTEC) 10 MG tablet Take 10 mg by mouth daily.  07/05/19  [provider]  ipratropium (ATROVENT) 0.03 % nasal spray Place 2 sprays into both nostrils 3 (three) times daily as needed for rhinitis. 12/09/18 07/05/19  Scot Jun, FNP      Allergies    Other    Review of Systems   Review of Systems  Constitutional:  Negative for chills and fever.  Musculoskeletal:  Positive for arthralgias.  Skin:  Negative for color change.  All other systems reviewed and are negative.  Physical Exam Updated Vital Signs BP (!) 137/104    Pulse 63    Temp 98.8 F (37.1 C) (Oral)    Resp 16    SpO2 100%  Physical Exam Vitals and nursing note reviewed.  Constitutional:      Appearance: He is not ill-appearing.  HENT:     Head: Normocephalic and atraumatic.  Eyes:     Conjunctiva/sclera: Conjunctivae normal.  Cardiovascular:     Rate and Rhythm: Normal rate and regular rhythm.  Pulmonary:     Effort: Pulmonary  effort is normal.     Breath sounds: Normal breath sounds.  Musculoskeletal:     Comments: Large diamond encrusted ring on L 4th digit. Cap refill < 3 seconds to digit. Sensation intact throughout. Swelling noted to PIP joint. 2+ radial pulse.   Skin:    General: Skin is warm and dry.     Coloration: Skin is not jaundiced.  Neurological:     Mental Status: He is alert.    ED Results / Procedures / Treatments   Labs (all labs ordered are listed, but only abnormal results are displayed) Labs Reviewed - No data to display  EKG None  Radiology No results found.  Procedures .Foreign Body Removal  Date/Time: 10/25/2021 11:43 PM Performed by: Eustaquio Maize, PA-C Authorized by: Eustaquio Maize, PA-C  Consent: Verbal consent obtained. Risks and benefits: risks, benefits and alternatives were discussed Patient identity confirmed: verbally with patient Intake: finger. Complexity: simple 1 objects recovered. Post-procedure assessment: foreign body removed Patient tolerance: patient tolerated the procedure well with no immediate complications Comments: Electric ring cutter used to remove ring.      Medications Ordered in ED Medications - No data to display  ED Course/ Medical Decision Making/ A&P  Medical Decision Making 26 year old male who presents to the ED today due to ring being stuck on left fourth digit x1.5 hours.  On arrival vitals are stable.  Patient appears to be in no acute distress.  He has a large ring stuck on his left fourth digit with swelling associated to the PIP joint of same.  He is neurovascularly intact throughout.  Ring cutter used for removal at this time, successfully.  Ring needed to be pried open with forceps as well.  Reevaluation patient continues to be neurovascularly intact.  No wounds to the finger appreciated from ring cutter.  Tetanus up-to-date.  Otherwise stable for discharge at this time.   Problems Addressed: Ring  or other jewelry causing external constriction, initial encounter: acute illness or injury          Final Clinical Impression(s) / ED Diagnoses Final diagnoses:  Ring or other jewelry causing external constriction, initial encounter    Rx / DC Orders ED Discharge Orders     None        Discharge Instructions      We have successfully removed the ring in the ED today. While at home please rest, ice, and elevate your left hand to help reduce pain/inflammation.   You can take Ibuprofen and Tylenol as needed for pain.   Return to the ED for any new/worsening symptoms       Eustaquio Maize, PA-C 10/25/21 2346

## 2021-10-25 NOTE — ED Triage Notes (Signed)
Pt has finger on he left ring finger that he can't get off.  There is swelling that is preventing ring from being removed. ?

## 2021-10-25 NOTE — Discharge Instructions (Signed)
We have successfully removed the ring in the ED today. While at home please rest, ice, and elevate your left hand to help reduce pain/inflammation.  ? ?You can take Ibuprofen and Tylenol as needed for pain.  ? ?Return to the ED for any new/worsening symptoms ?

## 2021-12-17 ENCOUNTER — Encounter: Payer: Self-pay | Admitting: Family Medicine

## 2021-12-18 NOTE — Telephone Encounter (Signed)
Patient calls nurse line in regards to [employer].  ? ?Patient states he needs a letter for his apartment to have an emotional support animal.  ? ?Patient advised to make an apt as he has not been seen since 2021. ? ?Patient reports he will call back to schedule.  ?

## 2022-01-26 ENCOUNTER — Encounter: Payer: Self-pay | Admitting: *Deleted

## 2022-03-25 ENCOUNTER — Ambulatory Visit: Payer: Self-pay

## 2022-03-25 ENCOUNTER — Ambulatory Visit
Admission: EM | Admit: 2022-03-25 | Discharge: 2022-03-25 | Disposition: A | Discharge status: Home / Self Care | Payer: 59 | Attending: Physician Assistant | Admitting: Physician Assistant

## 2022-03-25 DIAGNOSIS — B349 Viral infection, unspecified: Secondary | ICD-10-CM

## 2022-03-25 DIAGNOSIS — R197 Diarrhea, unspecified: Secondary | ICD-10-CM | POA: Diagnosis not present

## 2022-03-25 DIAGNOSIS — R112 Nausea with vomiting, unspecified: Secondary | ICD-10-CM

## 2022-03-25 LAB — POCT URINALYSIS DIP (MANUAL ENTRY)
Bilirubin, UA: NEGATIVE
Glucose, UA: NEGATIVE mg/dL
Ketones, POC UA: NEGATIVE mg/dL
Leukocytes, UA: NEGATIVE
Nitrite, UA: NEGATIVE
Protein Ur, POC: NEGATIVE mg/dL
Spec Grav, UA: 1.025 (ref 1.010–1.025)
Urobilinogen, UA: 0.2 E.U./dL
pH, UA: 6 (ref 5.0–8.0)

## 2022-03-25 MED ORDER — ONDANSETRON 4 MG PO TBDP: 4.0000 mg | ORAL_TABLET | Freq: Three times a day (TID) | ORAL | 0 refills | Status: DC | PRN

## 2022-03-25 MED ORDER — ONDANSETRON 4 MG PO TBDP
4.0000 mg | ORAL_TABLET | Freq: Once | ORAL | Status: AC
  Administered 2022-03-25: 4 mg via ORAL

## 2022-03-25 NOTE — ED Triage Notes (Signed)
The patient states he feels weak, congested and has a headache. The patient states he tested positive for Covid at home on Sunday.   Started: Sunday  Home interventions: none

## 2022-03-25 NOTE — Discharge Instructions (Signed)
I will contact you with your results as soon as I have them.  Make sure that you are resting and drinking plenty of fluid.  Eat small frequent meals.  Use Zofran for nausea and vomiting.  If you have any worsening symptoms including chest pain, shortness of breath, nausea/vomiting despite medication, abdominal pain, weakness you need to go to the emergency room immediately.

## 2022-03-25 NOTE — ED Provider Notes (Signed)
[facility]    CSN: [id number] Arrival date & time: 03/25/22  0919      History   Chief Complaint Chief Complaint  Patient presents with   Generalized Body Aches        Fatigue   Headache    HPI Jeffrey Rangel is a 26 y.o. male.   Patient presents today with a 5-day history of URI symptoms.  Reports generalized weakness, congestion, headache, nausea, vomiting, diarrhea.  Reports last episode of emesis was several days ago.  He has been eating and drinking normally but continues to feel poorly.  He took an at-home COVID test that was positive.  He has had COVID with last episode approximately 1 year ago.  He has not been taking any over-the-counter medication.  Denies any abdominal pain, chest pain, shortness of breath, cough.  He is feeling congested.  Denies any significant past medical history including asthma, COPD, smoking, diabetes, heart disease, malignancy.  He has missed work as result of symptoms.    Past Medical History:  Diagnosis Date   Bronchitis    hx of    Epigastric hernia 02/2014   GERD (gastroesophageal reflux disease)     Patient Active Problem List   Diagnosis Date Noted   Incisional hernia, without obstruction or gangrene 99/83/3825   Umbilical hernia 05/39/7673   Non-intractable vomiting    COVID    Chest pain 08/10/2017   Gastritis 08/10/2017   Constipation 41/93/7902   Supraumbilical hernia 40/97/3532   ECZEMA 03/04/2009   ALLERGIC RHINITIS 04/11/2007    Past Surgical History:  Procedure Laterality Date   EPIGASTRIC HERNIA REPAIR N/A 03/06/2014   Procedure: HERNIA REPAIR EPIGASTRIC PEDIATRIC X2;  Surgeon: Jerilynn Mages. Gerald Stabs, MD;  Location: [facility];  Service: Pediatrics;  Laterality: N/A;   SCROTAL EXPLORATION Bilateral 07/08/2013   Procedure: SCROTAL EXPLORATION WITH BILATERAL ORICHIOPEXY;  Surgeon: Franchot Gallo, MD;  Location: [facility];  Service: Urology;  Laterality: Bilateral;   UMBILICAL HERNIA REPAIR  N/A 10/13/2020   Procedure: OPEN HERNIA REPAIR UMBILICAL ADULT WITH MESH PATCH;  Surgeon: Armandina Gemma, MD;  Location: [facility];  Service: General;  Laterality: N/A;   VENTRAL HERNIA REPAIR N/A 10/13/2020   Procedure: OPEN REPAIR RECURRENT HERNIA REPAIR VENTRAL ADULT WITH MESH PATCH;  Surgeon: Armandina Gemma, MD;  Location: [facility];  Service: General;  Laterality: N/A;       Home Medications    Prior to Admission medications   Medication Sig Start Date End Date Taking? Authorizing Provider  ondansetron (ZOFRAN-ODT) 4 MG disintegrating tablet Take 1 tablet (4 mg total) by mouth every 8 (eight) hours as needed for nausea or vomiting. 03/25/22  Yes Dalayza Zambrana K, PA-C  pantoprazole (PROTONIX) 40 MG tablet Take 1 tablet (40 mg total) by mouth daily. 08/28/20   Petrucelli, Samantha R, PA-C  polyethylene glycol (MIRALAX) 17 g packet Take 17 g by mouth daily as needed for moderate constipation. 08/28/20   Petrucelli, Samantha R, PA-C  cetirizine (ZYRTEC) 10 MG tablet Take 10 mg by mouth daily.  07/05/19  [provider]  ipratropium (ATROVENT) 0.03 % nasal spray Place 2 sprays into both nostrils 3 (three) times daily as needed for rhinitis. 12/09/18 07/05/19  Scot Jun, FNP    Family History Family History  Problem Relation Age of Onset   Liver disease Father    Hypertension Mother    Asthma Mother    Asthma Brother    Diabetes Maternal Grandmother  Diabetes Maternal Grandfather     Social History Social History   Tobacco Use   Smoking status: Former   Smokeless tobacco: Former    Types: Nurse, children's Use: Never used  Substance Use Topics   Alcohol use: Yes    Comment: occas. beers   Drug use: No     Allergies   Other   Review of Systems Review of Systems  Constitutional:  Positive for activity change, appetite change and fatigue. Negative for fever.  Respiratory:  Negative for cough and shortness of breath.   Cardiovascular:  Negative for chest  pain.  Gastrointestinal:  Positive for diarrhea, nausea and vomiting. Negative for abdominal pain.  Neurological:  Positive for headaches. Negative for dizziness and light-headedness.     Physical Exam Triage Vital Signs ED Triage Vitals  Enc Vitals Group     BP 03/25/22 0935 128/89     Pulse Rate 03/25/22 0935 68     Resp 03/25/22 0935 16     Temp 03/25/22 0935 97.9 F (36.6 C)     Temp Source 03/25/22 0935 Oral     SpO2 03/25/22 0935 98 %     Weight --      Height --      Head Circumference --      Peak Flow --      Pain Score 03/25/22 0933 0     Pain Loc --      Pain Edu? --      Excl. in [place]? --    No data found.  Updated Vital Signs BP 128/89 (BP Location: Right Arm)   Pulse 68   Temp 97.9 F (36.6 C) (Oral)   Resp 16   SpO2 98%   Visual Acuity Right Eye Distance:   Left Eye Distance:   Bilateral Distance:    Right Eye Near:   Left Eye Near:    Bilateral Near:     Physical Exam Vitals reviewed.  Constitutional:      General: He is awake.     Appearance: Normal appearance. He is well-developed. He is not ill-appearing.     Comments: Very pleasant male appears stated age in no acute distress sitting comfortably in exam room  HENT:     Head: Normocephalic and atraumatic.     Right Ear: Tympanic membrane, ear canal and external ear normal. Tympanic membrane is not erythematous or bulging.     Left Ear: Tympanic membrane, ear canal and external ear normal. Tympanic membrane is not erythematous or bulging.     Nose: Nose normal.     Mouth/Throat:     Pharynx: Uvula midline. No oropharyngeal exudate or posterior oropharyngeal erythema.  Cardiovascular:     Rate and Rhythm: Normal rate and regular rhythm.     Heart sounds: Normal heart sounds, S1 normal and S2 normal. No murmur heard. Pulmonary:     Effort: Pulmonary effort is normal. No accessory muscle usage or respiratory distress.     Breath sounds: Normal breath sounds. No stridor. No wheezing, rhonchi  or rales.     Comments: Clear to auscultation bilaterally  Abdominal:     General: Bowel sounds are normal.     Palpations: Abdomen is soft.     Tenderness: There is no abdominal tenderness.     Comments: Benign abdominal exam  Neurological:     Mental Status: He is alert.  Psychiatric:        Behavior: Behavior is cooperative.  UC Treatments / Results  Labs (all labs ordered are listed, but only abnormal results are displayed) Labs Reviewed  POCT URINALYSIS DIP (MANUAL ENTRY) - Abnormal; Notable for the following components:      Result Value   Blood, UA small (*)    All other components within normal limits  NOVEL CORONAVIRUS, NAA  COMPREHENSIVE METABOLIC PANEL  CBC WITH DIFFERENTIAL/PLATELET    EKG   Radiology No results found.  Procedures Procedures (including critical care time)  Medications Ordered in UC Medications  ondansetron (ZOFRAN-ODT) disintegrating tablet 4 mg (4 mg Oral Given 03/25/22 0957)    Initial Impression / Assessment and Plan / UC Course  I have reviewed the triage vital signs and the nursing notes.  Pertinent labs & imaging results that were available during my care of the patient were reviewed by me and considered in my medical decision making (see chart for details).     Patient is well-appearing, afebrile, nontoxic, nontachycardic.  Discussed likely viral etiology of symptoms.  Patient did request repeat COVID testing for appointment purposes which was obtained today.  Given recurrent nausea and vomiting CBC and CMP were obtained-results pending.  Patient was given Zofran in clinic with improvement of nausea symptoms and able to pass oral challenge.  Prescription for this medication was sent to pharmacy with instruction use on a scheduled basis for the next several days then decrease use to as needed thereafter.  He is to push fluids and eat a bland diet.  Discussed that if he has any worsening symptoms he needs to be seen immediately  including chest pain, shortness of breath, nausea, vomiting, abdominal pain.  He was provided work excuse note.  Strict return precautions given.  Final Clinical Impressions(s) / UC Diagnoses   Final diagnoses:  Viral illness  Nausea vomiting and diarrhea     Discharge Instructions      I will contact you with your results as soon as I have them.  Make sure that you are resting and drinking plenty of fluid.  Eat small frequent meals.  Use Zofran for nausea and vomiting.  If you have any worsening symptoms including chest pain, shortness of breath, nausea/vomiting despite medication, abdominal pain, weakness you need to go to the emergency room immediately.     ED Prescriptions     Medication Sig Dispense Auth. Provider   ondansetron (ZOFRAN-ODT) 4 MG disintegrating tablet Take 1 tablet (4 mg total) by mouth every 8 (eight) hours as needed for nausea or vomiting. 20 tablet Willy Pinkerton, Derry Skill, PA-C      PDMP not reviewed this encounter.   Terrilee Croak, PA-C 03/25/22 1032

## 2022-03-26 LAB — COMPREHENSIVE METABOLIC PANEL
ALT: 34 IU/L (ref 0–44)
AST: 25 IU/L (ref 0–40)
Albumin/Globulin Ratio: 1.8 (ref 1.2–2.2)
Albumin: 4.4 g/dL (ref 4.3–5.2)
Alkaline Phosphatase: 56 IU/L (ref 44–121)
BUN/Creatinine Ratio: 10 (ref 9–20)
BUN: 12 mg/dL (ref 6–20)
Bilirubin Total: 0.9 mg/dL (ref 0.0–1.2)
CO2: 25 mmol/L (ref 20–29)
Calcium: 9.6 mg/dL (ref 8.7–10.2)
Chloride: 99 mmol/L (ref 96–106)
Creatinine, Ser: 1.22 mg/dL (ref 0.76–1.27)
Globulin, Total: 2.4 g/dL (ref 1.5–4.5)
Glucose: 95 mg/dL (ref 70–99)
Potassium: 3.9 mmol/L (ref 3.5–5.2)
Sodium: 138 mmol/L (ref 134–144)
Total Protein: 6.8 g/dL (ref 6.0–8.5)
eGFR: 85 mL/min/{1.73_m2} (ref >59)

## 2022-03-26 LAB — CBC WITH DIFFERENTIAL/PLATELET
Basophils Absolute: 0 10*3/uL (ref 0.0–0.2)
Basos: 0 %
EOS (ABSOLUTE): 0.2 10*3/uL (ref 0.0–0.4)
Eos: 3 %
Hematocrit: 49.7 % (ref 37.5–51.0)
Hemoglobin: 16.8 g/dL (ref 13.0–17.7)
Immature Grans (Abs): 0 10*3/uL (ref 0.0–0.1)
Immature Granulocytes: 0 %
Lymphocytes Absolute: 1.6 10*3/uL (ref 0.7–3.1)
Lymphs: 32 %
MCH: 29.1 pg (ref 26.6–33.0)
MCHC: 33.8 g/dL (ref 31.5–35.7)
MCV: 86 fL (ref 79–97)
Monocytes Absolute: 0.4 10*3/uL (ref 0.1–0.9)
Monocytes: 9 %
Neutrophils Absolute: 2.9 10*3/uL (ref 1.4–7.0)
Neutrophils: 56 %
Platelets: 173 10*3/uL (ref 150–450)
RBC: 5.78 x10E6/uL (ref 4.14–5.80)
RDW: 13.5 % (ref 11.6–15.4)
WBC: 5.1 10*3/uL (ref 3.4–10.8)

## 2022-03-26 LAB — NOVEL CORONAVIRUS, NAA: SARS-CoV-2, NAA: NOT DETECTED

## 2022-08-30 ENCOUNTER — Ambulatory Visit
Admission: EM | Admit: 2022-08-30 | Discharge: 2022-08-30 | Disposition: A | Discharge status: Home / Self Care | Payer: 59 | Attending: Internal Medicine | Admitting: Internal Medicine

## 2022-08-30 DIAGNOSIS — J309 Allergic rhinitis, unspecified: Secondary | ICD-10-CM | POA: Diagnosis not present

## 2022-08-30 DIAGNOSIS — J4521 Mild intermittent asthma with (acute) exacerbation: Secondary | ICD-10-CM | POA: Diagnosis not present

## 2022-08-30 DIAGNOSIS — J111 Influenza due to unidentified influenza virus with other respiratory manifestations: Secondary | ICD-10-CM | POA: Diagnosis not present

## 2022-08-30 MED ORDER — IBUPROFEN 400 MG PO TABS: 400.0000 mg | ORAL_TABLET | Freq: Three times a day (TID) | ORAL | 0 refills | Status: DC | PRN

## 2022-08-30 MED ORDER — ALBUTEROL SULFATE HFA 108 (90 BASE) MCG/ACT IN AERS: 2.0000 | INHALATION_SPRAY | Freq: Four times a day (QID) | RESPIRATORY_TRACT | 5 refills | Status: DC | PRN

## 2022-08-30 MED ORDER — ACETAMINOPHEN 325 MG PO TABS
650.0000 mg | ORAL_TABLET | Freq: Once | ORAL | Status: AC
  Administered 2022-08-30: 650 mg via ORAL

## 2022-08-30 MED ORDER — GUAIFENESIN 400 MG PO TABS: ORAL_TABLET | ORAL | 0 refills | Status: DC

## 2022-08-30 MED ORDER — ACETAMINOPHEN 500 MG PO TABS: 1000.0000 mg | ORAL_TABLET | Freq: Three times a day (TID) | ORAL | 0 refills | Status: DC | PRN

## 2022-08-30 MED ORDER — OSELTAMIVIR PHOSPHATE 75 MG PO CAPS: 75.0000 mg | ORAL_CAPSULE | Freq: Two times a day (BID) | ORAL | 0 refills | Status: AC

## 2022-08-30 MED ORDER — CETIRIZINE HCL 10 MG PO TABS: 10.0000 mg | ORAL_TABLET | Freq: Every day | ORAL | 1 refills | Status: AC

## 2022-08-30 MED ORDER — FLUTICASONE PROPIONATE 50 MCG/ACT NA SUSP: 1.0000 | Freq: Every day | NASAL | 1 refills | Status: DC

## 2022-08-30 MED ORDER — IPRATROPIUM BROMIDE 0.06 % NA SOLN: 2.0000 | Freq: Three times a day (TID) | NASAL | 1 refills | Status: DC

## 2022-08-30 MED ORDER — PROMETHAZINE-DM 6.25-15 MG/5ML PO SYRP: 5.0000 mL | ORAL_SOLUTION | Freq: Every evening | ORAL | 0 refills | Status: DC | PRN

## 2022-08-30 NOTE — ED Triage Notes (Signed)
Pt c/o being sick for 3-4 days. The pt c/o congestion, headaches, chills and body soreness.  Home interventions: cold and flu OTC meds

## 2022-08-30 NOTE — Discharge Instructions (Addendum)
Please read below to learn more about the medications, dosages and frequencies that I recommend to help alleviate your symptoms and to get you feeling better soon:   Tamiflu (oseltamivir): This is an antiviral medication used to treat influenza.  Please take 1 capsule twice daily for the next 5 days.  While this medication may not "cure" influenza, it will reduce the severity and shorten the duration of your illness.    Zyrtec (cetirizine): This is an excellent second-generation antihistamine that helps to reduce respiratory inflammatory response to environmental allergens.  In some patients, this medication can cause daytime sleepiness so I recommend that you take 1 tablet daily at bedtime.     Flonase (fluticasone): This is a steroid nasal spray that you use once daily, 1 spray in each nare.  This medication does not work well if you decide to use it only used as you feel you need to, it works best used on a daily basis.  After 3 to 5 days of use, you will notice significant reduction of the inflammation and mucus production that is currently being caused by exposure to allergens, whether seasonal or environmental.  The most common side effect of this medication is nosebleeds.  If you experience a nosebleed, please discontinue use for 1 week, then feel free to resume.  I have provided you with a prescription.     Atrovent (ipratropium): This is an excellent nasal decongestant spray I have added to your recommended nasal steroid that will not cause rebound congestion, please instill 2 sprays into each nare with each use.  Because nasal steroids can take several days before they begin to provide full benefit, I recommend that you use this spray in addition to the nasal steroid prescribed for you.  Please use it after you have used your nasal steroid and repeat up to 4 times daily as needed.  I have provided you with a prescription for this medication.      ProAir, Ventolin, Proventil (albuterol): This  inhaled medication contains a short acting beta agonist bronchodilator.  This medication works on the smooth muscle that opens and constricts of your airways by relaxing the muscle.  The result of relaxation of the smooth muscle is increased air movement and improved work of breathing.  This is a short acting medication that can be used every 4-6 hours as needed for increased work of breathing, shortness of breath, wheezing and excessive coughing.  I have provided you with a prescription.    Advil, Motrin (ibuprofen): This is a good anti-inflammatory medication which addresses aches, pains and inflammation of the upper airways that causes sinus and nasal congestion as well as in the lower airways which makes your cough feel tight and sometimes burn.  I recommend that you take between 400 to 600 mg every 6-8 hours as needed.      Tylenol (acetaminophen): This is a good fever reducer.  If your body temperature rises above 101.5 as measured with a thermometer, it is recommended that you take 1,000 mg every 8 hours until your temperature falls below 101.5, please not take more than 3,000 mg of acetaminophen either as a separate medication or as in ingredient in an over-the-counter cold/flu preparation within a 24-hour period.      Robitussin, Mucinex (guaifenesin): This is an expectorant.  This helps break up chest congestion and loosen up thick nasal drainage making phlegm and drainage more liquid and therefore easier to remove.  I recommend being 400 mg three times daily  as needed.      Promethazine DM: Promethazine is both a nasal decongestant and an antinausea medication that makes most patients feel fairly sleepy.  The DM is dextromethorphan, a cough suppressant found in many over-the-counter cough medications.  Please take 5 mL before bedtime to minimize your cough which will help you sleep better.  I have sent a prescription for this medication to your pharmacy.   Please follow-up within the next 5-7  days either with your primary care provider or urgent care if your symptoms do not resolve.  If you do not have a primary care provider, we will assist you in finding one.        Thank you for visiting urgent care today.  We appreciate the opportunity to participate in your care.

## 2022-08-30 NOTE — ED Provider Notes (Addendum)
[facility]    CSN: [id number] Arrival date & time: 08/30/22  1137    HISTORY   Chief Complaint  Patient presents with   Cough   Chills   Generalized Body Aches   Headache   HPI Jeffrey Rangel is a pleasant, 27 y.o. male who presents to urgent care today. Patient complains of being sick for the past 3 to 4 days with nasal congestion, runny nose, headache, tactile fever, chills and body soreness.  Patient states he has had a cough productive of yellow sputum as well.  Patient has normal vital signs on arrival today.  Patient states his girlfriend has similar symptoms.  Patient states he has been using over-the-counter nasal decongestant sprays and now cannot breathe out of his nose.  Patient endorses a history of allergies and asthma, not currently taking any medications for allergies or asthma.  The history is provided by the patient.   Past Medical History:  Diagnosis Date   Bronchitis    hx of    Epigastric hernia 02/2014   GERD (gastroesophageal reflux disease)    Patient Active Problem List   Diagnosis Date Noted   Incisional hernia, without obstruction or gangrene 29/93/7169   Umbilical hernia 67/89/3810   Non-intractable vomiting    COVID    Chest pain 08/10/2017   Gastritis 08/10/2017   Constipation 17/51/0258   Supraumbilical hernia 52/77/8242   ECZEMA 03/04/2009   ALLERGIC RHINITIS 04/11/2007   Past Surgical History:  Procedure Laterality Date   EPIGASTRIC HERNIA REPAIR N/A 03/06/2014   Procedure: HERNIA REPAIR EPIGASTRIC PEDIATRIC X2;  Surgeon: Jerilynn Mages. Gerald Stabs, MD;  Location: [place];  Service: Pediatrics;  Laterality: N/A;   SCROTAL EXPLORATION Bilateral 07/08/2013   Procedure: SCROTAL EXPLORATION WITH BILATERAL ORICHIOPEXY;  Surgeon: Franchot Gallo, MD;  Location: [place];  Service: Urology;  Laterality: Bilateral;   UMBILICAL HERNIA REPAIR N/A 10/13/2020   Procedure: OPEN HERNIA REPAIR UMBILICAL ADULT WITH MESH PATCH;   Surgeon: Armandina Gemma, MD;  Location: [facility];  Service: General;  Laterality: N/A;   VENTRAL HERNIA REPAIR N/A 10/13/2020   Procedure: OPEN REPAIR RECURRENT HERNIA REPAIR VENTRAL ADULT WITH MESH PATCH;  Surgeon: Armandina Gemma, MD;  Location: [facility];  Service: General;  Laterality: N/A;    Home Medications    Prior to Admission medications   Medication Sig Start Date End Date Taking? Authorizing Provider  ondansetron (ZOFRAN-ODT) 4 MG disintegrating tablet Take 1 tablet (4 mg total) by mouth every 8 (eight) hours as needed for nausea or vomiting. 03/25/22   Raspet, Derry Skill, PA-C  pantoprazole (PROTONIX) 40 MG tablet Take 1 tablet (40 mg total) by mouth daily. 08/28/20   Petrucelli, Samantha R, PA-C  polyethylene glycol (MIRALAX) 17 g packet Take 17 g by mouth daily as needed for moderate constipation. 08/28/20   Petrucelli, Samantha R, PA-C  cetirizine (ZYRTEC) 10 MG tablet Take 10 mg by mouth daily.  07/05/19  [provider]  ipratropium (ATROVENT) 0.03 % nasal spray Place 2 sprays into both nostrils 3 (three) times daily as needed for rhinitis. 12/09/18 07/05/19  Scot Jun, FNP    Family History Family History  Problem Relation Age of Onset   Liver disease Father    Hypertension Mother    Asthma Mother    Asthma Brother    Diabetes Maternal Grandmother    Diabetes Maternal Grandfather    Social History Social History   Tobacco Use   Smoking status: Former  Smokeless tobacco: Former    Types: Nurse, children's Use: Never used  Substance Use Topics   Alcohol use: Yes    Comment: occas. beers   Drug use: No   Allergies   Other  Review of Systems Review of Systems Pertinent findings revealed after performing a 14 point review of systems has been noted in the history of present illness.  Physical Exam Triage Vital Signs ED Triage Vitals  Enc Vitals Group     BP 06/19/21 0827 (!) 147/82     Pulse Rate 06/19/21 0827 72     Resp 06/19/21 0827 18      Temp 06/19/21 0827 98.3 F (36.8 C)     Temp Source 06/19/21 0827 Oral     SpO2 06/19/21 0827 98 %     Weight --      Height --      Head Circumference --      Peak Flow --      Pain Score 06/19/21 0826 5     Pain Loc --      Pain Edu? --      Excl. in [place]? --   No data found.  Updated Vital Signs BP 127/89 (BP Location: Left Arm)   Pulse 79   Temp 97.7 F (36.5 C) (Oral)   Resp 16   SpO2 97%   Physical Exam Constitutional:      Appearance: He is ill-appearing.  HENT:     Head: Normocephalic and atraumatic.     Salivary Glands: Right salivary gland is not diffusely enlarged or tender. Left salivary gland is not diffusely enlarged or tender.     Right Ear: Hearing, ear canal and external ear normal. Tympanic membrane is bulging.     Left Ear: Hearing, ear canal and external ear normal. Tympanic membrane is bulging.     Nose: Mucosal edema, congestion and rhinorrhea present. Rhinorrhea is clear.     Right Turbinates: Enlarged, swollen and pale.     Left Turbinates: Enlarged, swollen and pale.     Right Sinus: No maxillary sinus tenderness or frontal sinus tenderness.     Left Sinus: No maxillary sinus tenderness.     Mouth/Throat:     Lips: Pink.     Mouth: Mucous membranes are moist.     Pharynx: Pharyngeal swelling, posterior oropharyngeal erythema and uvula swelling present.     Tonsils: No tonsillar exudate. 0 on the right. 0 on the left.  Cardiovascular:     Rate and Rhythm: Normal rate and regular rhythm.     Pulses: Normal pulses.  Pulmonary:     Effort: Pulmonary effort is normal. No accessory muscle usage, prolonged expiration or respiratory distress.     Breath sounds: No stridor. No wheezing, rhonchi or rales.     Comments: Turbulent breath sounds throughout without wheeze, rale, rhonchi. Abdominal:     General: Abdomen is flat. Bowel sounds are normal.     Palpations: Abdomen is soft.  Musculoskeletal:        General: Normal range of motion.     Cervical  back: Full passive range of motion without pain, normal range of motion and neck supple.  Lymphadenopathy:     Cervical: Cervical adenopathy present.     Right cervical: Superficial cervical adenopathy and posterior cervical adenopathy present.     Left cervical: Superficial cervical adenopathy and posterior cervical adenopathy present.  Skin:    General: Skin is warm and dry.  Neurological:     General: No focal deficit present.     Mental Status: He is alert and oriented to person, place, and time.     Motor: Motor function is intact.     Coordination: Coordination is intact.     Gait: Gait is intact.     Deep Tendon Reflexes: Reflexes are normal and symmetric.  Psychiatric:        Attention and Perception: Attention and perception normal.        Mood and Affect: Mood and affect normal.        Speech: Speech normal.        Behavior: Behavior normal. Behavior is cooperative.        Thought Content: Thought content normal.     Visual Acuity Right Eye Distance:   Left Eye Distance:   Bilateral Distance:    Right Eye Near:   Left Eye Near:    Bilateral Near:     UC Couse / Diagnostics / Procedures:     Radiology No results found.  Procedures Procedures (including critical care time) EKG  Pending results:  Labs Reviewed - No data to display   Medications Ordered in UC: Medications  acetaminophen (TYLENOL) tablet 650 mg (650 mg Oral Given 08/30/22 1232)    UC Diagnoses / Final Clinical Impressions(s)   I have reviewed the triage vital signs and the nursing notes.  Pertinent labs & imaging results that were available during my care of the patient were reviewed by me and considered in my medical decision making (see chart for details).    Final diagnoses:  Influenza-like illness  Allergic rhinitis, unspecified seasonality, unspecified trigger  Mild intermittent asthma with acute exacerbation   Patient advised that we do not have any means to test for influenza at  this time but that I recommend he began taking Tamiflu empirically for influenza-like illness.  Patient was provided with renewals of allergy medications as well.  Patient advised to discontinue over-the-counter nasal decongestant as it is likely perpetuating his nasal congestion.  Return precautions advised. Please see discharge instructions below for further details of plan of care as provided to patient. ED Prescriptions     Medication Sig Dispense Auth. Provider   cetirizine (ZYRTEC ALLERGY) 10 MG tablet Take 1 tablet (10 mg total) by mouth at bedtime. 90 tablet Lynden Oxford Scales, PA-C   ipratropium (ATROVENT) 0.06 % nasal spray Place 2 sprays into both nostrils 3 (three) times daily. As needed for nasal congestion, runny nose 15 mL Lynden Oxford Scales, PA-C   fluticasone (FLONASE) 50 MCG/ACT nasal spray Place 1 spray into both nostrils daily. 47.4 mL Lynden Oxford Scales, PA-C   oseltamivir (TAMIFLU) 75 MG capsule Take 1 capsule (75 mg total) by mouth every 12 (twelve) hours for 5 days. 10 capsule Lynden Oxford Scales, PA-C   guaifenesin (HUMIBID E) 400 MG TABS tablet Take 1 tablet 3 times daily as needed for chest congestion and cough 21 tablet Lynden Oxford Scales, PA-C   promethazine-dextromethorphan (PROMETHAZINE-DM) 6.25-15 MG/5ML syrup Take 5 mLs by mouth at bedtime as needed for cough. 60 mL Lynden Oxford Scales, PA-C   albuterol (VENTOLIN HFA) 108 (90 Base) MCG/ACT inhaler Inhale 2 puffs into the lungs every 6 (six) hours as needed for wheezing or shortness of breath (Cough). 18 g Lynden Oxford Scales, PA-C   ibuprofen (ADVIL) 400 MG tablet Take 1 tablet (400 mg total) by mouth every 8 (eight) hours as needed for up to 30 doses. 30 tablet  Lynden Oxford Scales, PA-C   acetaminophen (TYLENOL) 500 MG tablet Take 2 tablets (1,000 mg total) by mouth every 8 (eight) hours as needed for up to 30 doses for mild pain or fever. 60 tablet Lynden Oxford Scales, PA-C      PDMP  not reviewed this encounter.  Disposition Upon Discharge:  Condition: stable for discharge home Home: take medications as prescribed; routine discharge instructions as discussed; follow up as advised.  Patient presented with an acute illness with associated systemic symptoms and significant discomfort requiring urgent management. In my opinion, this is a condition that a prudent lay person (someone who possesses an average knowledge of health and medicine) may potentially expect to result in complications if not addressed urgently such as respiratory distress, impairment of bodily function or dysfunction of bodily organs.   Routine symptom specific, illness specific and/or disease specific instructions were discussed with the patient and/or caregiver at length.   As such, the patient has been evaluated and assessed, work-up was performed and treatment was provided in alignment with urgent care protocols and evidence based medicine.  Patient/parent/caregiver has been advised that the patient may require follow up for further testing and treatment if the symptoms continue in spite of treatment, as clinically indicated and appropriate.  If the patient was tested for COVID-19, Influenza and/or RSV, then the patient/parent/guardian was advised to isolate at home pending the results of his/her diagnostic coronavirus test and potentially longer if they're positive. I have also advised pt that if his/her COVID-19 test returns positive, it's recommended to self-isolate for at least 10 days after symptoms first appeared AND until fever-free for 24 hours without fever reducer AND other symptoms have improved or resolved. Discussed self-isolation recommendations as well as instructions for household member/close contacts as per the [facility] and [place] DHHS, and also gave patient the [place] packet with this information.  Patient/parent/caregiver has been advised to return to the [facility] or PCP in 3-5 days if no better; to PCP or  the Emergency Department if new signs and symptoms develop, or if the current signs or symptoms continue to change or worsen for further workup, evaluation and treatment as clinically indicated and appropriate  The patient will follow up with their current PCP if and as advised. If the patient does not currently have a PCP we will assist them in obtaining one.   The patient may need specialty follow up if the symptoms continue, in spite of conservative treatment and management, for further workup, evaluation, consultation and treatment as clinically indicated and appropriate.  Patient/parent/caregiver verbalized understanding and agreement of plan as discussed.  All questions were addressed during visit.  Please see discharge instructions below for further details of plan.  Discharge Instructions:   Discharge Instructions      Please read below to learn more about the medications, dosages and frequencies that I recommend to help alleviate your symptoms and to get you feeling better soon:   Tamiflu (oseltamivir): This is an antiviral medication used to treat influenza.  Please take 1 capsule twice daily for the next 5 days.  While this medication may not "cure" influenza, it will reduce the severity and shorten the duration of your illness.    Zyrtec (cetirizine): This is an excellent second-generation antihistamine that helps to reduce respiratory inflammatory response to environmental allergens.  In some patients, this medication can cause daytime sleepiness so I recommend that you take 1 tablet daily at bedtime.     Flonase (fluticasone): This is a steroid  nasal spray that you use once daily, 1 spray in each nare.  This medication does not work well if you decide to use it only used as you feel you need to, it works best used on a daily basis.  After 3 to 5 days of use, you will notice significant reduction of the inflammation and mucus production that is currently being caused by exposure to  allergens, whether seasonal or environmental.  The most common side effect of this medication is nosebleeds.  If you experience a nosebleed, please discontinue use for 1 week, then feel free to resume.  I have provided you with a prescription.     Atrovent (ipratropium): This is an excellent nasal decongestant spray I have added to your recommended nasal steroid that will not cause rebound congestion, please instill 2 sprays into each nare with each use.  Because nasal steroids can take several days before they begin to provide full benefit, I recommend that you use this spray in addition to the nasal steroid prescribed for you.  Please use it after you have used your nasal steroid and repeat up to 4 times daily as needed.  I have provided you with a prescription for this medication.      ProAir, Ventolin, Proventil (albuterol): This inhaled medication contains a short acting beta agonist bronchodilator.  This medication works on the smooth muscle that opens and constricts of your airways by relaxing the muscle.  The result of relaxation of the smooth muscle is increased air movement and improved work of breathing.  This is a short acting medication that can be used every 4-6 hours as needed for increased work of breathing, shortness of breath, wheezing and excessive coughing.  I have provided you with a prescription.    Advil, Motrin (ibuprofen): This is a good anti-inflammatory medication which addresses aches, pains and inflammation of the upper airways that causes sinus and nasal congestion as well as in the lower airways which makes your cough feel tight and sometimes burn.  I recommend that you take between 400 to 600 mg every 6-8 hours as needed.      Tylenol (acetaminophen): This is a good fever reducer.  If your body temperature rises above 101.5 as measured with a thermometer, it is recommended that you take 1,000 mg every 8 hours until your temperature falls below 101.5, please not take more than  3,000 mg of acetaminophen either as a separate medication or as in ingredient in an over-the-counter cold/flu preparation within a 24-hour period.      Robitussin, Mucinex (guaifenesin): This is an expectorant.  This helps break up chest congestion and loosen up thick nasal drainage making phlegm and drainage more liquid and therefore easier to remove.  I recommend being 400 mg three times daily as needed.      Promethazine DM: Promethazine is both a nasal decongestant and an antinausea medication that makes most patients feel fairly sleepy.  The DM is dextromethorphan, a cough suppressant found in many over-the-counter cough medications.  Please take 5 mL before bedtime to minimize your cough which will help you sleep better.  I have sent a prescription for this medication to your pharmacy.   Please follow-up within the next 5-7 days either with your primary care provider or urgent care if your symptoms do not resolve.  If you do not have a primary care provider, we will assist you in finding one.        Thank you for visiting urgent care  today.  We appreciate the opportunity to participate in your care.       This office note has been dictated using Museum/gallery curator.  Unfortunately, this method of dictation can sometimes lead to typographical or grammatical errors.  I apologize for your inconvenience in advance if this occurs.  Please do not hesitate to reach out to me if clarification is needed.      Lynden Oxford Scales, PA-C 08/30/22 1401    Lynden Oxford [place], [place] 08/30/22 [phone]

## 2022-10-26 ENCOUNTER — Ambulatory Visit
Admission: EM | Admit: 2022-10-26 | Discharge: 2022-10-26 | Disposition: A | Discharge status: Home / Self Care | Payer: 59 | Attending: Nurse Practitioner | Admitting: Nurse Practitioner

## 2022-10-26 ENCOUNTER — Ambulatory Visit (INDEPENDENT_AMBULATORY_CARE_PROVIDER_SITE_OTHER): Payer: 59

## 2022-10-26 DIAGNOSIS — S6392XA Sprain of unspecified part of left wrist and hand, initial encounter: Secondary | ICD-10-CM | POA: Diagnosis present

## 2022-10-26 DIAGNOSIS — Z113 Encounter for screening for infections with a predominantly sexual mode of transmission: Secondary | ICD-10-CM | POA: Diagnosis present

## 2022-10-26 DIAGNOSIS — M79642 Pain in left hand: Secondary | ICD-10-CM | POA: Diagnosis present

## 2022-10-26 NOTE — ED Triage Notes (Addendum)
Pt injured left wrist while lifting object at work around 2am this morning. C/o pain, tender to touch, redness at base of thumb/palm. Pt inquires about STD testing, denies symptoms or known exposure.

## 2022-10-26 NOTE — ED Provider Notes (Signed)
[facility]    CSN: [id number] Arrival date & time: 10/26/22  [id number]      History   Chief Complaint Chief Complaint  Patient presents with   Wrist Pain    HPI KALIX SCHEY is a 27 y.o. male presents for evaluation of hand pain.  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  He reports he lifted a piece in the stage that weighed somewhere around 150 pounds when he felt a pop to the base of his left thumb and has since had pain, swelling, and bruising to the area.  No numbness or tingling.  Denies any wrist pain.  No history of surgeries or fractures to the left hand in the past.  He has not used any OTC medications for treatment.  In addition he would like STD screening.  Denies any known exposure.  Denies any dysuria, testicular pain or swelling, or penile discharge.  No other concerns at this time.   Wrist Pain    Past Medical History:  Diagnosis Date   Bronchitis    hx of    Epigastric hernia 02/2014   GERD (gastroesophageal reflux disease)     Patient Active Problem List   Diagnosis Date Noted   Incisional hernia, without obstruction or gangrene [id number]   Umbilical hernia [id number]   Non-intractable vomiting    COVID    Chest pain 08/10/2017   Gastritis 08/10/2017   Constipation [id number]   Supraumbilical hernia [id number]   ECZEMA 03/04/2009   ALLERGIC RHINITIS 04/11/2007    Past Surgical History:  Procedure Laterality Date   EPIGASTRIC HERNIA REPAIR N/A 03/06/2014   Procedure: HERNIA REPAIR EPIGASTRIC PEDIATRIC X2;  Surgeon: Jerilynn Mages. 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   Procedure: SCROTAL EXPLORATION WITH BILATERAL ORICHIOPEXY;  Surgeon: Franchot Gallo, MD;  Location: [place];  Service: Urology;  Laterality: Bilateral;   UMBILICAL HERNIA REPAIR N/A 10/13/2020   Procedure: OPEN HERNIA REPAIR UMBILICAL  ADULT WITH MESH PATCH;  Surgeon: Armandina Gemma, MD;  Location: [facility];  Service: General;  Laterality: N/A;   VENTRAL HERNIA REPAIR N/A 10/13/2020   Procedure: OPEN REPAIR RECURRENT HERNIA REPAIR VENTRAL ADULT WITH MESH PATCH;  Surgeon: Armandina Gemma, MD;  Location: [facility];  Service: General;  Laterality: N/A;       Home Medications    Prior to Admission medications   Medication Sig Start Date End Date Taking? Authorizing Provider  acetaminophen (TYLENOL) 500 MG tablet Take 2 tablets (1,000 mg total) by mouth every 8 (eight) hours as needed for up to 30 doses for mild pain or fever. 08/30/22   Lynden Oxford Scales, PA-C  albuterol (VENTOLIN HFA) 108 (90 Base) MCG/ACT inhaler Inhale 2 puffs into the lungs every 6 (six) hours as needed for wheezing or shortness of breath (Cough). 08/30/22   Lynden Oxford Scales, PA-C  cetirizine (ZYRTEC ALLERGY) 10 MG tablet Take 1 tablet (10 mg total) by mouth at bedtime. 08/30/22 02/26/23  Lynden Oxford Scales, PA-C  fluticasone (FLONASE) 50 MCG/ACT nasal spray Place 1 spray into both nostrils daily. 08/30/22   Lynden Oxford Scales, PA-C  guaifenesin (HUMIBID E) 400 MG TABS tablet Take 1 tablet 3 times daily as needed for chest congestion and cough 08/30/22   Lynden Oxford Scales, PA-C  ibuprofen (ADVIL) 400 MG tablet Take 1 tablet (400 mg total) by mouth every 8 (eight) hours as needed for  up to 30 doses. 08/30/22   Lynden Oxford Scales, PA-C  ipratropium (ATROVENT) 0.06 % nasal spray Place 2 sprays into both nostrils 3 (three) times daily. As needed for nasal congestion, runny nose 08/30/22   Lynden Oxford Scales, PA-C  promethazine-dextromethorphan (PROMETHAZINE-DM) 6.25-15 MG/5ML syrup Take 5 mLs by mouth at bedtime as needed for cough. 08/30/22   Lynden Oxford Scales, PA-C    Family History Family History  Problem Relation Age of Onset   Liver disease Father    Hypertension Mother    Asthma Mother    Asthma Brother    Diabetes Maternal Grandmother     Diabetes Maternal Grandfather     Social History Social History   Tobacco Use   Smoking status: Former   Smokeless tobacco: Former    Types: Nurse, children's Use: Never used  Substance Use Topics   Alcohol use: Yes    Comment: occas. beers   Drug use: No     Allergies   Other   Review of Systems Review of Systems  Genitourinary:        STD screening  Musculoskeletal:        Left hand pain      Physical Exam Triage Vital Signs ED Triage Vitals  Enc Vitals Group     BP 10/26/22 1003 131/83     Pulse Rate 10/26/22 1003 87     Resp 10/26/22 1003 18     Temp 10/26/22 1003 97.8 F (36.6 C)     Temp Source 10/26/22 1003 Oral     SpO2 10/26/22 1003 98 %     Weight --      Height --      Head Circumference --      Peak Flow --      Pain Score 10/26/22 1001 9     Pain Loc --      Pain Edu? --      Excl. in [place]? --    No data found.  Updated Vital Signs BP 131/83 (BP Location: Right Arm)   Pulse 87   Temp 97.8 F (36.6 C) (Oral)   Resp 18   SpO2 98%   Visual Acuity Right Eye Distance:   Left Eye Distance:   Bilateral Distance:    Right Eye Near:   Left Eye Near:    Bilateral Near:     Physical Exam Vitals and nursing note reviewed.  Constitutional:      Appearance: Normal appearance.  HENT:     Head: Normocephalic and atraumatic.  Eyes:     Pupils: Pupils are equal, round, and reactive to light.  Cardiovascular:     Rate and Rhythm: Normal rate.  Pulmonary:     Effort: Pulmonary effort is normal.  Musculoskeletal:       Hands:  Skin:    General: Skin is warm and dry.  Neurological:     General: No focal deficit present.     Mental Status: He is alert and oriented to person, place, and time.  Psychiatric:        Mood and Affect: Mood normal.        Behavior: Behavior normal.      UC Treatments / Results  Labs (all labs ordered are listed, but only abnormal results are displayed) Labs Reviewed  CYTOLOGY, (ORAL, ANAL,  URETHRAL) ANCILLARY ONLY    EKG   Radiology DG Hand Complete Left  Result Date: 10/26/2022 CLINICAL DATA:  Possible trauma,  pain EXAM: LEFT HAND - COMPLETE 3+ VIEW COMPARISON:  None Available. FINDINGS: No fracture or dislocation is seen. Minimal bony spurs seen in first metacarpophalangeal joint. Soft tissues are unremarkable. There are no opaque foreign bodies. IMPRESSION: No fracture or dislocation is seen. Degenerative changes with minimal bony spurs are seen in first metacarpophalangeal joint. Electronically Signed   By: Elmer Picker M.D.   On: 10/26/2022 10:24    Procedures Procedures (including critical care time)  Medications Ordered in UC Medications - No data to display  Initial Impression / Assessment and Plan / UC Course  I have reviewed the triage vital signs and the nursing notes.  Pertinent labs & imaging results that were available during my care of the patient were reviewed by me and considered in my medical decision making (see chart for details).     STD testing is ordered and will contact if positive Reviewed x-ray results with patient, no fracture.  Discussed sprain RICE therapy and Ace wrap applied in clinic by nursing staff OTC analgesics as needed Patient instructed to follow-up with occupational medicine in 1 week ER precautions reviewed and patient verbalized understanding Final Clinical Impressions(s) / UC Diagnoses   Final diagnoses:  Left hand pain  Screening examination for STD (sexually transmitted disease)  Hand sprain, left, initial encounter     Discharge Instructions      The clinic will contact you with results of the STD testing done today if positive Ace wrap to the left hand as needed for swelling and support Over-the-counter ibuprofen or Tylenol as needed Rest Ice to the area as needed Please follow-up with your occupational medicine clinic in 1 week  Please go to the emergency room for any worsening symptoms     ED  Prescriptions   None    PDMP not reviewed this encounter.   Melynda Ripple, NP 10/26/22 1051

## 2022-10-26 NOTE — Discharge Instructions (Signed)
The clinic will contact you with results of the STD testing done today if positive Ace wrap to the left hand as needed for swelling and support Over-the-counter ibuprofen or Tylenol as needed Rest Ice to the area as needed Please follow-up with your occupational medicine clinic in 1 week  Please go to the emergency room for any worsening symptoms

## 2022-10-27 LAB — CYTOLOGY, (ORAL, ANAL, URETHRAL) ANCILLARY ONLY
Chlamydia: NEGATIVE
Comment: NEGATIVE
Comment: NEGATIVE
Comment: NORMAL
Neisseria Gonorrhea: NEGATIVE
Trichomonas: NEGATIVE

## 2022-11-10 ENCOUNTER — Other Ambulatory Visit: Payer: Self-pay | Admitting: General Practice

## 2022-11-10 ENCOUNTER — Ambulatory Visit
Admission: RE | Admit: 2022-11-10 | Discharge: 2022-11-10 | Disposition: A | Discharge status: Home / Self Care | Payer: No Typology Code available for payment source | Source: Ambulatory Visit | Attending: Nurse Practitioner | Admitting: Nurse Practitioner

## 2022-11-10 DIAGNOSIS — S6992XA Unspecified injury of left wrist, hand and finger(s), initial encounter: Secondary | ICD-10-CM

## 2023-01-13 ENCOUNTER — Emergency Department (HOSPITAL_COMMUNITY): Payer: 59

## 2023-01-13 ENCOUNTER — Emergency Department (HOSPITAL_COMMUNITY)
Admission: EM | Admit: 2023-01-13 | Discharge: 2023-01-14 | Disposition: A | Discharge status: Home / Self Care | Payer: 59 | Attending: Emergency Medicine | Admitting: Emergency Medicine

## 2023-01-13 ENCOUNTER — Other Ambulatory Visit (HOSPITAL_COMMUNITY): Payer: Self-pay

## 2023-01-13 ENCOUNTER — Encounter (HOSPITAL_COMMUNITY): Payer: Self-pay

## 2023-01-13 DIAGNOSIS — R7989 Other specified abnormal findings of blood chemistry: Secondary | ICD-10-CM | POA: Insufficient documentation

## 2023-01-13 DIAGNOSIS — R42 Dizziness and giddiness: Secondary | ICD-10-CM | POA: Diagnosis not present

## 2023-01-13 DIAGNOSIS — R0602 Shortness of breath: Secondary | ICD-10-CM | POA: Insufficient documentation

## 2023-01-13 DIAGNOSIS — R55 Syncope and collapse: Secondary | ICD-10-CM | POA: Diagnosis not present

## 2023-01-13 DIAGNOSIS — R519 Headache, unspecified: Secondary | ICD-10-CM | POA: Insufficient documentation

## 2023-01-13 LAB — BASIC METABOLIC PANEL
Anion gap: 13 (ref 5–15)
BUN: 16 mg/dL (ref 6–20)
CO2: 23 mmol/L (ref 22–32)
Calcium: 9.5 mg/dL (ref 8.9–10.3)
Chloride: 104 mmol/L (ref 98–111)
Creatinine, Ser: 1.28 mg/dL — ABNORMAL HIGH (ref 0.61–1.24)
GFR, Estimated: >60 mL/min (ref >60)
Glucose, Bld: 119 mg/dL — ABNORMAL HIGH (ref 70–99)
Potassium: 3.3 mmol/L — ABNORMAL LOW (ref 3.5–5.1)
Sodium: 140 mmol/L (ref 135–145)

## 2023-01-13 LAB — URINALYSIS, ROUTINE W REFLEX MICROSCOPIC
Bacteria, UA: NONE SEEN
Bilirubin Urine: NEGATIVE
Glucose, UA: NEGATIVE mg/dL
Hgb urine dipstick: NEGATIVE
Ketones, ur: 20 mg/dL — AB
Leukocytes,Ua: NEGATIVE
Nitrite: NEGATIVE
Protein, ur: 30 mg/dL — AB
Specific Gravity, Urine: 1.027 (ref 1.005–1.030)
pH: 5 (ref 5.0–8.0)

## 2023-01-13 LAB — CBC
HCT: 44.5 % (ref 39.0–52.0)
Hemoglobin: 15.2 g/dL (ref 13.0–17.0)
MCH: 29.7 pg (ref 26.0–34.0)
MCHC: 34.2 g/dL (ref 30.0–36.0)
MCV: 86.9 fL (ref 80.0–100.0)
Platelets: 176 10*3/uL (ref 150–400)
RBC: 5.12 MIL/uL (ref 4.22–5.81)
RDW: 13.4 % (ref 11.5–15.5)
WBC: 14.1 10*3/uL — ABNORMAL HIGH (ref 4.0–10.5)
nRBC: 0 % (ref 0.0–0.2)

## 2023-01-13 LAB — CBG MONITORING, ED: Glucose-Capillary: 126 mg/dL — ABNORMAL HIGH (ref 70–99)

## 2023-01-13 LAB — RAPID URINE DRUG SCREEN, HOSP PERFORMED
Amphetamines: NOT DETECTED
Barbiturates: NOT DETECTED
Benzodiazepines: NOT DETECTED
Cocaine: NOT DETECTED
Opiates: NOT DETECTED
Tetrahydrocannabinol: NOT DETECTED

## 2023-01-13 LAB — D-DIMER, QUANTITATIVE: D-Dimer, Quant: 1.51 ug/mL-FEU — ABNORMAL HIGH (ref 0.00–0.50)

## 2023-01-13 LAB — ETHANOL: Alcohol, Ethyl (B): <10 mg/dL (ref <10)

## 2023-01-13 LAB — TROPONIN I (HIGH SENSITIVITY)
Troponin I (High Sensitivity): 18 ng/L — ABNORMAL HIGH (ref <18)
Troponin I (High Sensitivity): 18 ng/L — ABNORMAL HIGH (ref <18)

## 2023-01-13 MED ORDER — METOCLOPRAMIDE HCL 5 MG/ML IJ SOLN
10.0000 mg | Freq: Once | INTRAMUSCULAR | Status: AC
  Administered 2023-01-13: 10 mg via INTRAVENOUS
  Filled 2023-01-13: qty 2

## 2023-01-13 MED ORDER — SODIUM CHLORIDE 0.9 % IV BOLUS
1000.0000 mL | Freq: Once | INTRAVENOUS | Status: AC
  Administered 2023-01-13: 1000 mL via INTRAVENOUS

## 2023-01-13 MED ORDER — KETOROLAC TROMETHAMINE 30 MG/ML IJ SOLN
30.0000 mg | Freq: Once | INTRAMUSCULAR | Status: AC
  Administered 2023-01-14: 30 mg via INTRAVENOUS
  Filled 2023-01-13: qty 1

## 2023-01-13 MED ORDER — IOHEXOL 350 MG/ML SOLN
75.0000 mL | Freq: Once | INTRAVENOUS | Status: AC | PRN
  Administered 2023-01-13: 75 mL via INTRAVENOUS

## 2023-01-13 MED ORDER — DIPHENHYDRAMINE HCL 50 MG/ML IJ SOLN
25.0000 mg | Freq: Once | INTRAMUSCULAR | Status: AC
  Administered 2023-01-13: 25 mg via INTRAVENOUS
  Filled 2023-01-13: qty 1

## 2023-01-13 MED ORDER — SODIUM CHLORIDE 0.9 % IV BOLUS
1000.0000 mL | Freq: Once | INTRAVENOUS | Status: AC
  Administered 2023-01-14: 1000 mL via INTRAVENOUS

## 2023-01-13 MED ORDER — DEXAMETHASONE SODIUM PHOSPHATE 10 MG/ML IJ SOLN
10.0000 mg | Freq: Once | INTRAMUSCULAR | Status: AC
  Administered 2023-01-13: 10 mg via INTRAVENOUS
  Filled 2023-01-13: qty 1

## 2023-01-13 NOTE — ED Triage Notes (Addendum)
Pt BIBA from parking lot. Pt was driving home from work and felt like he was having a near syncopal episode. Upon EMS arrival, pt was cold, diaphoretic. Pt now clammy but improved. Pt admits to ETOH consumption last night  CBG 140 118/70 NSR  18 L AC 500 NS 4mg  zofran

## 2023-01-13 NOTE — ED Provider Triage Note (Signed)
Emergency Medicine Provider Triage Evaluation Note  Jeffrey Rangel , a 27 y.o. male  was evaluated in triage.  Pt complains of feeling poorly after driving home playing basketball this morning.  Admits to poor p.o. intake.  Feels dizzy, lightheaded, nauseous, sweaty.  No chest pain or shortness of breath.  Denies any drug or alcohol use..  Review of Systems  Positive: Near syncope, nausea, sweating Negative: Chest pain, SOB.  Physical Exam  BP 127/87 (BP Location: Right Arm)   Pulse 77   Temp 97.6 F (36.4 C) (Oral)   Resp 17   Ht 6\' 5"  (1.956 m)   Wt 83.9 kg   SpO2 100%   BMI 21.94 kg/m  Gen:   Awake, no distress   Resp:  Normal effort  MSK:   Moves extremities without difficulty  Other:  Not forthcoming with information.  Denies chest pain or shortness of breath, moves all extremities  Medical Decision Making  Medically screening exam initiated at 4:00 PM.  Appropriate orders placed.  Jeffrey Rangel was informed that the remainder of the evaluation will be completed by another provider, this initial triage assessment does not replace that evaluation, and the importance of remaining in the ED until their evaluation is complete.  Near syncope, diaphoresis, nausea.  EKG with lateral J-point elevation similar to previous   Glynn Octave, MD 01/13/23 [phone]

## 2023-01-13 NOTE — ED Notes (Addendum)
Patient transported to XRAY 

## 2023-01-13 NOTE — ED Provider Notes (Signed)
[facility] AT [facility] Provider Note   CSN: [id number] Arrival date & time: 01/13/23  1541     History  Chief Complaint  Patient presents with   Near Syncope    Jeffrey Rangel is a 27 y.o. male, no pertinent past medical history, who presents to the ED secondary to an episode of severe headache, that came on suddenly this a.m., as well as a presyncopal episode that occurred while driving home.  Patient notes that he was out playing basketball, with some friends, and got in the car, started feeling very dizzy and had a sudden severe headache, as well as nausea, diaphoresis, and shortness of breath.  He states that the dizziness would not stop, and he felt so bad that he pulled over to the side of the road, stay to the side of the road for a little bit, and attempted to calm down, because he thought he was having anxiety attack, but that showed no relief.  He drove home, anyway, and symptoms persisted.  Decided to come to the ER per family members.  He feels like he may have possibly passed out while in the car.  Shortness of breath has improved, however his headache is persistent, and worse.  He notes that lights, sounds, make this worse.  Denies any chest pain, abdominal pain, urinary symptoms.  Home Medications Prior to Admission medications   Medication Sig Start Date End Date Taking? Authorizing Provider  albuterol (VENTOLIN HFA) 108 (90 Base) MCG/ACT inhaler Inhale 2 puffs into the lungs every 6 (six) hours as needed for wheezing or shortness of breath (Cough). 08/30/22  Yes Theadora Rama Scales, PA-C  cetirizine (ZYRTEC ALLERGY) 10 MG tablet Take 1 tablet (10 mg total) by mouth at bedtime. 08/30/22 02/26/23 Yes Theadora Rama Scales, PA-C  fluticasone (FLONASE) 50 MCG/ACT nasal spray Place 1 spray into both nostrils daily. 08/30/22  Yes Theadora Rama Scales, PA-C  naproxen sodium (ALEVE) 220 MG tablet Take 220 mg by mouth daily as needed (For pain).    Yes [provider]  acetaminophen (TYLENOL) 500 MG tablet Take 2 tablets (1,000 mg total) by mouth every 8 (eight) hours as needed for up to 30 doses for mild pain or fever. Patient not taking: Reported on 01/13/2023 08/30/22   Theadora Rama Scales, PA-C  guaifenesin (HUMIBID E) 400 MG TABS tablet Take 1 tablet 3 times daily as needed for chest congestion and cough Patient not taking: Reported on 01/13/2023 08/30/22   Theadora Rama Scales, PA-C  ibuprofen (ADVIL) 400 MG tablet Take 1 tablet (400 mg total) by mouth every 8 (eight) hours as needed for up to 30 doses. Patient not taking: Reported on 01/13/2023 08/30/22   Theadora Rama Scales, PA-C  ipratropium (ATROVENT) 0.06 % nasal spray Place 2 sprays into both nostrils 3 (three) times daily. As needed for nasal congestion, runny nose Patient not taking: Reported on 01/13/2023 08/30/22   Theadora Rama Scales, PA-C  promethazine-dextromethorphan (PROMETHAZINE-DM) 6.25-15 MG/5ML syrup Take 5 mLs by mouth at bedtime as needed for cough. Patient not taking: Reported on 01/13/2023 08/30/22   Theadora Rama Scales, PA-C      Allergies    Other    Review of Systems   Review of Systems  Respiratory:  Positive for shortness of breath.   Cardiovascular:  Positive for near-syncope. Negative for chest pain.  Neurological:  Positive for syncope.    Physical Exam Updated Vital Signs BP 129/89   Pulse 77  Temp 98.3 F (36.8 C) (Oral)   Resp 14   Ht 6\' 5"  (1.956 m)   Wt 83.9 kg   SpO2 100%   BMI 21.94 kg/m  Physical Exam Vitals and nursing note reviewed.  Constitutional:      General: He is not in acute distress.    Appearance: He is well-developed.  HENT:     Head: Normocephalic and atraumatic.  Eyes:     Conjunctiva/sclera: Conjunctivae normal.  Cardiovascular:     Rate and Rhythm: Normal rate and regular rhythm.     Heart sounds: No murmur heard. Pulmonary:     Effort: Pulmonary effort is normal. No respiratory distress.      Breath sounds: Normal breath sounds.  Abdominal:     Palpations: Abdomen is soft.     Tenderness: There is no abdominal tenderness.  Musculoskeletal:        General: No swelling.     Cervical back: Neck supple.  Skin:    General: Skin is warm and dry.     Capillary Refill: Capillary refill takes less than 2 seconds.  Neurological:     General: No focal deficit present.     Mental Status: He is alert and oriented to person, place, and time.  Psychiatric:        Mood and Affect: Mood normal.     ED Results / Procedures / Treatments   Labs (all labs ordered are listed, but only abnormal results are displayed) Labs Reviewed  BASIC METABOLIC PANEL - Abnormal; Notable for the following components:      Result Value   Potassium 3.3 (*)    Glucose, Bld 119 (*)    Creatinine, Ser 1.28 (*)    All other components within normal limits  CBC - Abnormal; Notable for the following components:   WBC 14.1 (*)    All other components within normal limits  URINALYSIS, ROUTINE W REFLEX MICROSCOPIC - Abnormal; Notable for the following components:   APPearance HAZY (*)    Ketones, ur 20 (*)    Protein, ur 30 (*)    All other components within normal limits  D-DIMER, QUANTITATIVE - Abnormal; Notable for the following components:   D-Dimer, Quant 1.51 (*)    All other components within normal limits  CBG MONITORING, ED - Abnormal; Notable for the following components:   Glucose-Capillary 126 (*)    All other components within normal limits  TROPONIN I (HIGH SENSITIVITY) - Abnormal; Notable for the following components:   Troponin I (High Sensitivity) 18 (*)    All other components within normal limits  TROPONIN I (HIGH SENSITIVITY) - Abnormal; Notable for the following components:   Troponin I (High Sensitivity) 18 (*)    All other components within normal limits  ETHANOL  RAPID URINE DRUG SCREEN, HOSP PERFORMED    EKG EKG Interpretation  Date/Time:  Thursday Jan 13 2023 15:50:40  EDT Ventricular Rate:  69 PR Interval:  172 QRS Duration: 86 QT Interval:  372 QTC Calculation: 398 R Axis:   74 Text Interpretation: Normal sinus rhythm When compared with ECG of 04-Sep-2020 23:50, PREVIOUS ECG IS PRESENT early repolarization seen on earlier tracing Confirmed by Arby Barrette [phone]) on 01/13/2023 9:28:11 PM  Radiology No results found.  Procedures Procedures    Medications Ordered in ED Medications  dexamethasone (DECADRON) injection 10 mg (10 mg Intravenous Given 01/13/23 2111)  sodium chloride 0.9 % bolus 1,000 mL (1,000 mLs Intravenous New Bag/Given 01/13/23 2115)  metoCLOPramide (REGLAN) injection  10 mg (10 mg Intravenous Given 01/13/23 2110)  diphenhydrAMINE (BENADRYL) injection 25 mg (25 mg Intravenous Given 01/13/23 2110)    ED Course/ Medical Decision Making/ A&P                             Medical Decision Making Patient is a 27 year old male, here for syncopal episode that occurred today, and a severe headache.  His headache came on suddenly, and then developed shortness of breath, dizziness, and has felt well unwell since then.  Headache is intractable, he has no acute neurodeficits however given sudden onset of headache, we will obtain a head CT, as well as D-dimer, given his syncopal episode and chest x-ray.  Amount and/or Complexity of Data Reviewed Labs: ordered.    Details: Troponins mildly elevated at 18 x 2, D-dimer elevated at 1.51 Radiology: ordered. ECG/medicine tests:  Decision-making details documented in ED Course. Discussion of management or test interpretation with external provider(s): Discussed with Dr. Clarice Pole, patient's troponins elevated at 18, D-dimer elevated at 1.51, we will obtain a CTA chest, to rule out a PE given his shortness of breath, dizzy episode, elevated dimer.  Head CT CTA pending at this time.  Signed out to her she will follow-up and disposition the patient appropriately.  Additionally will need to reeval after  headache medication.  Risk Prescription drug management.   Final Clinical Impression(s) / ED Diagnoses Final diagnoses:  None    Rx / DC Orders ED Discharge Orders     None         Guneet Delpino, [place], PA 01/13/23 2159    Arby Barrette, MD 01/14/23 [phone]

## 2023-01-14 ENCOUNTER — Emergency Department (HOSPITAL_COMMUNITY): Payer: 59

## 2023-01-14 ENCOUNTER — Other Ambulatory Visit: Payer: Self-pay

## 2023-01-14 MED ORDER — IBUPROFEN 600 MG PO TABS: 600.0000 mg | ORAL_TABLET | Freq: Four times a day (QID) | ORAL | 0 refills | Status: AC | PRN

## 2023-01-14 MED ORDER — METOCLOPRAMIDE HCL 10 MG PO TABS: 10.0000 mg | ORAL_TABLET | Freq: Four times a day (QID) | ORAL | 0 refills | Status: DC

## 2023-01-14 MED ORDER — DIPHENHYDRAMINE HCL 25 MG PO TABS: 25.0000 mg | ORAL_TABLET | Freq: Four times a day (QID) | ORAL | 0 refills | Status: DC | PRN

## 2023-01-14 MED ORDER — IOHEXOL 350 MG/ML SOLN
75.0000 mL | Freq: Once | INTRAVENOUS | Status: AC | PRN
  Administered 2023-01-14: 75 mL via INTRAVENOUS

## 2023-01-14 MED ORDER — MAGNESIUM SULFATE 2 GM/50ML IV SOLN
2.0000 g | Freq: Once | INTRAVENOUS | Status: AC
  Administered 2023-01-14: 2 g via INTRAVENOUS
  Filled 2023-01-14: qty 50

## 2023-01-14 NOTE — Discharge Instructions (Addendum)
1.  You had a sudden severe headache.  At this time you have been evaluated for aneurysm or other serious cause of sudden headache.  At this time there is not evidence of these problems.  However, you will need close follow-up with your doctor and possibly referral to a neurologist.  You have been given a prescription for a combination of ibuprofen, Reglan and Benadryl to take for headache.  You may take this combination every 6 hours as needed.  You do not need to continue taking this regularly.  Try to rest and hydrate. 2.  Return to the emergency room immediately if you get new worsening or concerning symptoms. 3.  You may have migraine headaches however this diagnosis should be made after several evaluations and pattern of headaches.

## 2023-01-16 ENCOUNTER — Encounter (HOSPITAL_COMMUNITY): Payer: Self-pay

## 2023-01-16 ENCOUNTER — Emergency Department (HOSPITAL_COMMUNITY)
Admission: EM | Admit: 2023-01-16 | Discharge: 2023-01-16 | Disposition: A | Discharge status: Home / Self Care | Payer: 59 | Attending: Emergency Medicine | Admitting: Emergency Medicine

## 2023-01-16 ENCOUNTER — Emergency Department (HOSPITAL_COMMUNITY): Payer: 59

## 2023-01-16 ENCOUNTER — Ambulatory Visit
Admission: EM | Admit: 2023-01-16 | Discharge: 2023-01-16 | Disposition: A | Discharge status: Home / Self Care | Payer: 59

## 2023-01-16 DIAGNOSIS — D72829 Elevated white blood cell count, unspecified: Secondary | ICD-10-CM | POA: Diagnosis not present

## 2023-01-16 DIAGNOSIS — M542 Cervicalgia: Secondary | ICD-10-CM | POA: Insufficient documentation

## 2023-01-16 DIAGNOSIS — R42 Dizziness and giddiness: Secondary | ICD-10-CM | POA: Insufficient documentation

## 2023-01-16 DIAGNOSIS — H538 Other visual disturbances: Secondary | ICD-10-CM | POA: Diagnosis not present

## 2023-01-16 DIAGNOSIS — R519 Headache, unspecified: Secondary | ICD-10-CM

## 2023-01-16 DIAGNOSIS — M546 Pain in thoracic spine: Secondary | ICD-10-CM | POA: Diagnosis not present

## 2023-01-16 LAB — COMPREHENSIVE METABOLIC PANEL
ALT: 20 U/L (ref 0–44)
AST: 19 U/L (ref 15–41)
Albumin: 4.1 g/dL (ref 3.5–5.0)
Alkaline Phosphatase: 43 U/L (ref 38–126)
Anion gap: 12 (ref 5–15)
BUN: 9 mg/dL (ref 6–20)
CO2: 25 mmol/L (ref 22–32)
Calcium: 9.4 mg/dL (ref 8.9–10.3)
Chloride: 102 mmol/L (ref 98–111)
Creatinine, Ser: 1.1 mg/dL (ref 0.61–1.24)
GFR, Estimated: >60 mL/min (ref >60)
Glucose, Bld: 93 mg/dL (ref 70–99)
Potassium: 3.6 mmol/L (ref 3.5–5.1)
Sodium: 139 mmol/L (ref 135–145)
Total Bilirubin: 1.7 mg/dL — ABNORMAL HIGH (ref 0.3–1.2)
Total Protein: 7 g/dL (ref 6.5–8.1)

## 2023-01-16 LAB — TROPONIN I (HIGH SENSITIVITY)
Troponin I (High Sensitivity): 5 ng/L (ref <18)
Troponin I (High Sensitivity): 3 ng/L (ref <18)

## 2023-01-16 LAB — CBC WITH DIFFERENTIAL/PLATELET
Abs Immature Granulocytes: 0.02 10*3/uL (ref 0.00–0.07)
Basophils Absolute: 0 10*3/uL (ref 0.0–0.1)
Basophils Relative: 1 %
Eosinophils Absolute: 0.1 10*3/uL (ref 0.0–0.5)
Eosinophils Relative: 1 %
HCT: 50.4 % (ref 39.0–52.0)
Hemoglobin: 16.6 g/dL (ref 13.0–17.0)
Immature Granulocytes: 0 %
Lymphocytes Relative: 23 %
Lymphs Abs: 1.6 10*3/uL (ref 0.7–4.0)
MCH: 29.5 pg (ref 26.0–34.0)
MCHC: 32.9 g/dL (ref 30.0–36.0)
MCV: 89.7 fL (ref 80.0–100.0)
Monocytes Absolute: 0.7 10*3/uL (ref 0.1–1.0)
Monocytes Relative: 10 %
Neutro Abs: 4.4 10*3/uL (ref 1.7–7.7)
Neutrophils Relative %: 65 %
Platelets: 173 10*3/uL (ref 150–400)
RBC: 5.62 MIL/uL (ref 4.22–5.81)
RDW: 13.3 % (ref 11.5–15.5)
WBC: 6.8 10*3/uL (ref 4.0–10.5)
nRBC: 0 % (ref 0.0–0.2)

## 2023-01-16 LAB — LACTIC ACID, PLASMA: Lactic Acid, Venous: 1.4 mmol/L (ref 0.5–1.9)

## 2023-01-16 MED ORDER — METOCLOPRAMIDE HCL 5 MG/ML IJ SOLN
10.0000 mg | Freq: Once | INTRAMUSCULAR | Status: AC
  Administered 2023-01-16: 10 mg via INTRAVENOUS
  Filled 2023-01-16: qty 2

## 2023-01-16 MED ORDER — DIPHENHYDRAMINE HCL 50 MG/ML IJ SOLN
12.5000 mg | Freq: Once | INTRAMUSCULAR | Status: AC
  Administered 2023-01-16: 12.5 mg via INTRAVENOUS
  Filled 2023-01-16: qty 1

## 2023-01-16 MED ORDER — KETOROLAC TROMETHAMINE 30 MG/ML IJ SOLN
30.0000 mg | Freq: Once | INTRAMUSCULAR | Status: AC
  Administered 2023-01-16: 30 mg via INTRAVENOUS
  Filled 2023-01-16: qty 1

## 2023-01-16 MED ORDER — ONDANSETRON 4 MG PO TBDP: 4.0000 mg | ORAL_TABLET | Freq: Three times a day (TID) | ORAL | 0 refills | Status: DC | PRN

## 2023-01-16 MED ORDER — SODIUM CHLORIDE 0.9 % IV BOLUS
1000.0000 mL | Freq: Once | INTRAVENOUS | Status: AC
  Administered 2023-01-16: 1000 mL via INTRAVENOUS

## 2023-01-16 NOTE — ED Provider Notes (Signed)
[place] EMERGENCY DEPARTMENT AT [facility] Provider Note   CSN: [id number] Arrival date & time: 01/16/23  1124     History  Chief Complaint  Patient presents with   Neck Pain   Headache    Jeffrey Rangel is a 27 y.o. male.  With a history of GERD who presents to the ED for evaluation of headache and spinal pain.  He was initially seen in the ED on 01/13/2023 for an episode of syncope and headache.  He had an overall reassuring workup at that time.  His headache was treated while and he was discharged in good condition.  States his headache returned later that night and has been present ever since.  He has been taking ibuprofen and Benadryl with minimal improvement.  Headache is described as a throbbing sensation and generalized throughout the head.  He reports intermittent blurred vision and dizziness when walking or changing positions.  He also reports nausea without emesis.  He has had diarrhea as well.  States he has had 2 episodes of diarrhea daily for the past 2 days.  Stools described as nonbloody.  He denies fevers or chills.  He states the pain in his back is from the base of his neck to the base of his shoulder blades and midline.  He denies neck stiffness.  Also endorses some congestion and mild cough.  He denies chest pain, shortness of breath, abdominal pain, recent travel.  States he is up-to-date on all of his vaccines.   Neck Pain Associated symptoms: headaches   Headache Associated symptoms: neck pain        Home Medications Prior to Admission medications   Medication Sig Start Date End Date Taking? Authorizing Provider  ondansetron (ZOFRAN-ODT) 4 MG disintegrating tablet Take 1 tablet (4 mg total) by mouth every 8 (eight) hours as needed for nausea or vomiting. 01/16/23  Yes Amarie Viles, Edsel Petrin, PA-C  acetaminophen (TYLENOL) 500 MG tablet Take 2 tablets (1,000 mg total) by mouth every 8 (eight) hours as needed for up to 30 doses for mild pain or  fever. Patient not taking: Reported on 01/13/2023 08/30/22   Theadora Rama Scales, PA-C  albuterol (VENTOLIN HFA) 108 (90 Base) MCG/ACT inhaler Inhale 2 puffs into the lungs every 6 (six) hours as needed for wheezing or shortness of breath (Cough). 08/30/22   Theadora Rama Scales, PA-C  cetirizine (ZYRTEC ALLERGY) 10 MG tablet Take 1 tablet (10 mg total) by mouth at bedtime. 08/30/22 02/26/23  Theadora Rama Scales, PA-C  diphenhydrAMINE (BENADRYL) 25 MG tablet Take 1 tablet (25 mg total) by mouth every 6 (six) hours as needed. 01/14/23   Arby Barrette, MD  fluticasone (FLONASE) 50 MCG/ACT nasal spray Place 1 spray into both nostrils daily. 08/30/22   Theadora Rama Scales, PA-C  guaifenesin (HUMIBID E) 400 MG TABS tablet Take 1 tablet 3 times daily as needed for chest congestion and cough Patient not taking: Reported on 01/13/2023 08/30/22   Theadora Rama Scales, PA-C  ibuprofen (ADVIL) 400 MG tablet Take 1 tablet (400 mg total) by mouth every 8 (eight) hours as needed for up to 30 doses. Patient not taking: Reported on 01/13/2023 08/30/22   Theadora Rama Scales, PA-C  ibuprofen (ADVIL) 600 MG tablet Take 1 tablet (600 mg total) by mouth every 6 (six) hours as needed. 01/14/23   Arby Barrette, MD  ipratropium (ATROVENT) 0.06 % nasal spray Place 2 sprays into both nostrils 3 (three) times daily. As needed for nasal congestion, runny  nose Patient not taking: Reported on 01/13/2023 08/30/22   Theadora Rama Scales, PA-C  metoCLOPramide (REGLAN) 10 MG tablet Take 1 tablet (10 mg total) by mouth every 6 (six) hours. 01/14/23   Arby Barrette, MD  naproxen sodium (ALEVE) 220 MG tablet Take 220 mg by mouth daily as needed (For pain).    [provider]  promethazine-dextromethorphan (PROMETHAZINE-DM) 6.25-15 MG/5ML syrup Take 5 mLs by mouth at bedtime as needed for cough. Patient not taking: Reported on 01/13/2023 08/30/22   Theadora Rama Scales, PA-C      Allergies    Other    Review of Systems    Review of Systems  Musculoskeletal:  Positive for neck pain.  Neurological:  Positive for headaches.  All other systems reviewed and are negative.   Physical Exam Updated Vital Signs BP (!) 136/98   Pulse (!) 59   Temp 98.7 F (37.1 C)   Resp 16   Ht 6\' 5"  (1.956 m)   Wt 83.9 kg   SpO2 100%   BMI 21.94 kg/m  Physical Exam Vitals and nursing note reviewed.  Constitutional:      General: He is not in acute distress.    Appearance: He is well-developed.  HENT:     Head: Normocephalic and atraumatic.  Eyes:     Extraocular Movements: Extraocular movements intact.     Conjunctiva/sclera: Conjunctivae normal.     Pupils: Pupils are equal, round, and reactive to light.     Comments: Right sided nystagmus  Cardiovascular:     Rate and Rhythm: Normal rate and regular rhythm.     Heart sounds: No murmur heard. Pulmonary:     Effort: Pulmonary effort is normal. No respiratory distress.     Breath sounds: Normal breath sounds.  Abdominal:     Palpations: Abdomen is soft.     Tenderness: There is no abdominal tenderness.  Musculoskeletal:        General: No swelling.     Cervical back: Neck supple.     Comments: TTP to the midline and paraspinal C and T-spine's.  No step-offs, deformities or crepitus.  No bruising or lesions.  Full AROM of the neck.  Skin:    General: Skin is warm and dry.     Capillary Refill: Capillary refill takes less than 2 seconds.  Neurological:     Mental Status: He is alert and oriented to person, place, and time.     GCS: GCS eye subscore is 4. GCS verbal subscore is 5. GCS motor subscore is 6.     Comments:   MENTAL STATUS: AAOx3   LANG/SPEECH: Fluent, intact naming, repetition & comprehension   CRANIAL NERVES:   II: Pupils equal and reactive   III, IV, VI: EOM intact, no gaze preference or deviation, no nystagmus   V: normal sensation of the face   VII: no facial asymmetry   VIII: normal hearing to speech   MOTOR: 5/5 in both upper and lower  extremities   SENSORY: Normal to touch in all extremiteis   COORD: Normal finger to nose, heel to shin and shoulder shrug, no tremor, no dysmetria. No pronator drift  Negative Kernig's and Brudzinski's signs.  Psychiatric:        Mood and Affect: Mood normal.        Behavior: Behavior normal.     ED Results / Procedures / Treatments   Labs (all labs ordered are listed, but only abnormal results are displayed) Labs Reviewed  COMPREHENSIVE  METABOLIC PANEL - Abnormal; Notable for the following components:      Result Value   Total Bilirubin 1.7 (*)    All other components within normal limits  LACTIC ACID, PLASMA  CBC WITH DIFFERENTIAL/PLATELET  LACTIC ACID, PLASMA  TROPONIN I (HIGH SENSITIVITY)  TROPONIN I (HIGH SENSITIVITY)    EKG EKG Interpretation  Date/Time:  Sunday Jan 16 2023 11:36:00 EDT Ventricular Rate:  67 PR Interval:  138 QRS Duration: 80 QT Interval:  350 QTC Calculation: 369 R Axis:   76 Text Interpretation: Normal sinus rhythm Confirmed by Gloris Manchester ([id number]) on 01/16/2023 2:22:38 PM  Radiology No results found.  Procedures Procedures    Medications Ordered in ED Medications  sodium chloride 0.9 % bolus 1,000 mL (1,000 mLs Intravenous New Bag/Given 01/16/23 1339)  metoCLOPramide (REGLAN) injection 10 mg (10 mg Intravenous Given 01/16/23 1339)  diphenhydrAMINE (BENADRYL) injection 12.5 mg (12.5 mg Intravenous Given 01/16/23 1339)  ketorolac (TORADOL) 30 MG/ML injection 30 mg (30 mg Intravenous Given 01/16/23 1339)    ED Course/ Medical Decision Making/ A&P Clinical Course as of 01/16/23 1537  Sun Jan 16, 2023  1417 On reevaluation patient reports full resolution of his symptoms. [AS]  1531 Eosinophils Absolute: 0.1 [AS]    Clinical Course User Index [AS] Lorna Strother, Edsel Petrin, PA-C                             Medical Decision Making Amount and/or Complexity of Data Reviewed Labs: ordered.  This patient presents to the ED for concern of headache,  this involves an extensive number of treatment options, and is a complaint that carries with it a high risk of complications and morbidity.  Emergent considerations for headache include subarachnoid hemorrhage, meningitis, temporal arteritis, glaucoma, cerebral ischemia, carotid/vertebral dissection, intracranial tumor, Venous sinus thrombosis, carbon monoxide poisoning, acute or chronic subdural hemorrhage.  Other considerations include: Migraine, Cluster headache, Hypertension, Caffeine, alcohol, or drug withdrawal, Pseudotumor cerebri, Arteriovenous malformation, Head injury, Neurocysticercosis, Post-lumbar puncture, Preeclampsia, Tension headache, Sinusitis, Cervical arthritis, Refractive error causing strain, Dental abscess, Otitis media, Temporomandibular joint syndrome, Depression, Somatoform disorder (eg, somatization) Trigeminal neuralgia, Glossopharyngeal neuralgia.   My initial workup includes labs, imaging, symptom control  Additional history obtained from: Nursing notes from this visit. Previous records within EMR system urgent care visit from prior to arrival. Family mother and significant other are present and provides a portion of the history.  No one else in the house has similar symptoms.  I ordered, reviewed and interpreted labs which include: CBC, CMP, lactate, troponin.  Bilirubin mildly elevated to 1.7.  Labs otherwise within normal limits.   I ordered imaging studies including CT head I independently visualized and interpreted imaging which showed pending at the time of shift change I agree with the radiologist interpretation  Cardiac Monitoring:  The patient was maintained on a cardiac monitor.  I personally viewed and interpreted the cardiac monitored which showed an underlying rhythm of: NSR  Afebrile, hemodynamically stable.  27 year old male presents ED for evaluation of persistent headache.  He was seen here 3 days prior for headache and syncope.  Symptoms improved at  that time and was discharged in stable condition.  After discharge his symptoms returned.  He then presented to urgent care where his provider was concerned for meningitis given his neck pain.  This pain is described as more of an ache.  He denies neck stiffness.  He has no nuchal rigidity or other  evidence of meningismus on exam.  His labs are overall very reassuring.  His neurologic exam is reassuring as well.  No focal abnormalities.  Given lack of fever, leukocytosis, meningeal signs on exam, oriented mental status, lack of risk factors I have very low suspicion for meningitis as the cause of his symptoms.  He did report full resolution of his symptoms while in the ED after treatment.  He was also complaining of some congestion and rhinorrhea.  Symptoms may be secondary to a viral upper respiratory infection. Care will be handed off to Fayrene Helper, PA-C. If CT head is normal, patient will be stable for discharge. If there are any abnormalities, he will need further workup.   Patient's case discussed with Dr. Durwin Nora who agrees with plan to discharge with follow-up.   Note: Portions of this report may have been transcribed using voice recognition software. Every effort was made to ensure accuracy; however, inadvertent computerized transcription errors may still be present.        Final Clinical Impression(s) / ED Diagnoses Final diagnoses:  Bad headache  Neck pain    Rx / DC Orders ED Discharge Orders          Ordered    ondansetron (ZOFRAN-ODT) 4 MG disintegrating tablet  Every 8 hours PRN        01/16/23 1518              Mora Bellman 01/16/23 1537    Gloris Manchester, MD 01/18/23 [phone]

## 2023-01-16 NOTE — ED Triage Notes (Signed)
Pt returns to ER after being seen at [facility] today. Pt c/o headache, upper back and neck pain, and seeing spots. Pt states that UC was concerned for meningitis.  Neck pain since 1800 yesterday.  Pt endorses diarrhea and nausea. Denies emesis.  Pt states he has been "unbalanced"

## 2023-01-16 NOTE — ED Notes (Signed)
Pt reports improved headache.

## 2023-01-16 NOTE — ED Notes (Signed)
Provider at bedside

## 2023-01-16 NOTE — ED Triage Notes (Addendum)
Pt reports headache, neck pain, upper back pain, lightheaded and see white spots when moving around. Pt reports he faint 2 days ago and was seen at the ED.   Pt reports he was concern as his Troponin was high at the ED.

## 2023-01-16 NOTE — ED Notes (Signed)
This RN assumed care of patient and received off going transfer of care report from off going RN. Pt presented to the ED with headache, neck and back pain for several days.  Pt is resting on gurney at this time, respirations are spontaneous, even, unlabored and symmetrical bilaterally. Pt skin tone is appropriate for ethnicity, dry and warm. Pt connected to CCM, pulse ox and BP. Support persons at bedside and call light within reach.

## 2023-01-16 NOTE — ED Notes (Signed)
PA at bedside.

## 2023-01-16 NOTE — Discharge Instructions (Signed)
You have been seen today for your complaint of headache. Your lab work  was reassuring and showed no abnormalities. Your imaging was reassuring and showed no abnormalities. Your discharge medications include zofran. This is a nausea medicine. Take it as prescribed if you develop nausea. Home care instructions are as follows:  Continue to stay well hydrated Follow up with: your PCP in 3 to 5 days for reevaluation Please seek immediate medical care if you develop any of the following symptoms: Your headache: Becomes severe quickly. Gets worse after moderate to intense physical activity. You have any of these symptoms: Repeated vomiting. Pain or stiffness in your neck. Changes to your vision. Pain in an eye or ear. Problems with speech. Muscular weakness or loss of muscle control. Loss of balance or coordination. You feel faint or pass out. You have confusion. You have a seizure. At this time there does not appear to be the presence of an emergent medical condition, however there is always the potential for conditions to change. Please read and follow the below instructions.  Do not take your medicine if  develop an itchy rash, swelling in your mouth or lips, or difficulty breathing; call 911 and seek immediate emergency medical attention if this occurs.  You may review your lab tests and imaging results in their entirety on your MyChart account.  Please discuss all results of fully with your primary care provider and other specialist at your follow-up visit.  Note: Portions of this text may have been transcribed using voice recognition software. Every effort was made to ensure accuracy; however, inadvertent computerized transcription errors may still be present.

## 2023-01-16 NOTE — ED Notes (Signed)
NAD noted, respirations are equal bilaterally and unlabored at this time. Pt resting in gurney and denies any unmet needs. Pt connected to CCM, pulseox & BP. Support persons at bedside and call light within reach.

## 2023-01-16 NOTE — ED Notes (Signed)
This RN reviewed discharge instructions with patient. He verbalized understanding and denied any further questions. PT well appearing upon discharge and reports tolerable pain. Pt ambulated with stable gait to exit. Pt endorses ride home.  

## 2023-01-16 NOTE — Discharge Instructions (Signed)
Jeffrey Rangel you are in need of a higher level of care than we can provide in the urgent care setting.  Please go back to the emergency room to have more testing done and intervention than we can provide.

## 2023-01-16 NOTE — ED Notes (Signed)
Patient is being discharged from the Urgent Care and sent to the Emergency Department via POV (mother) . Per [place], [place], patient is in need of higher level of care due to ruled out meningococcal meningitis . Patient is aware and verbalizes understanding of plan of care.  Vitals:   01/16/23 0953  BP: 128/89  Pulse: 74  Resp: 16  Temp: 99.3 F (37.4 C)  SpO2: 98%

## 2023-01-16 NOTE — ED Provider Notes (Signed)
[facility]  Note:  This document was prepared using Conservation officer, historic buildings and may include unintentional dictation errors.  MRN: [id number] DOB: 1996/01/03  Subjective:   Jeffrey Rangel is a 27 y.o. male presenting for persistent headaches, neck pain, upper back pain and dizziness, lightheadedness, scotomas.  Patient was seen through the emergency room 01/13/2023 for syncope and collapse.  He ended up having extensive workup.  Pertinent abnormal lab results include leukocytosis of 14.1, elevated D-dimer of 1.51, borderline troponin of 18.  All CT imaging and chest x-ray were negative as well.  He received headache medication management in the ER.  Unfortunately, he continues to have symptoms.  Has not had resolution or improvement in his symptoms.  Denies feeling confused, having high fever, cough, chest pain, shortness of breath.  No rashes.  No drug use.  No history of neurologic conditions.  No smoking.  No current facility-administered medications for this encounter.  Current Outpatient Medications:    acetaminophen (TYLENOL) 500 MG tablet, Take 2 tablets (1,000 mg total) by mouth every 8 (eight) hours as needed for up to 30 doses for mild pain or fever. (Patient not taking: Reported on 01/13/2023), Disp: 60 tablet, Rfl: 0   albuterol (VENTOLIN HFA) 108 (90 Base) MCG/ACT inhaler, Inhale 2 puffs into the lungs every 6 (six) hours as needed for wheezing or shortness of breath (Cough)., Disp: 18 g, Rfl: 5   cetirizine (ZYRTEC ALLERGY) 10 MG tablet, Take 1 tablet (10 mg total) by mouth at bedtime., Disp: 90 tablet, Rfl: 1   diphenhydrAMINE (BENADRYL) 25 MG tablet, Take 1 tablet (25 mg total) by mouth every 6 (six) hours as needed., Disp: 30 tablet, Rfl: 0   fluticasone (FLONASE) 50 MCG/ACT nasal spray, Place 1 spray into both nostrils daily., Disp: 47.4 mL, Rfl: 1   guaifenesin (HUMIBID E) 400 MG TABS tablet, Take 1 tablet 3 times daily as needed for chest  congestion and cough (Patient not taking: Reported on 01/13/2023), Disp: 21 tablet, Rfl: 0   ibuprofen (ADVIL) 400 MG tablet, Take 1 tablet (400 mg total) by mouth every 8 (eight) hours as needed for up to 30 doses. (Patient not taking: Reported on 01/13/2023), Disp: 30 tablet, Rfl: 0   ibuprofen (ADVIL) 600 MG tablet, Take 1 tablet (600 mg total) by mouth every 6 (six) hours as needed., Disp: 30 tablet, Rfl: 0   ipratropium (ATROVENT) 0.06 % nasal spray, Place 2 sprays into both nostrils 3 (three) times daily. As needed for nasal congestion, runny nose (Patient not taking: Reported on 01/13/2023), Disp: 15 mL, Rfl: 1   metoCLOPramide (REGLAN) 10 MG tablet, Take 1 tablet (10 mg total) by mouth every 6 (six) hours., Disp: 30 tablet, Rfl: 0   naproxen sodium (ALEVE) 220 MG tablet, Take 220 mg by mouth daily as needed (For pain)., Disp: , Rfl:    promethazine-dextromethorphan (PROMETHAZINE-DM) 6.25-15 MG/5ML syrup, Take 5 mLs by mouth at bedtime as needed for cough. (Patient not taking: Reported on 01/13/2023), Disp: 60 mL, Rfl: 0   Allergies  Allergen Reactions   Other     "green peppers"    Past Medical History:  Diagnosis Date   Bronchitis    hx of    Epigastric hernia 02/2014   GERD (gastroesophageal reflux disease)      Past Surgical History:  Procedure Laterality Date   EPIGASTRIC HERNIA REPAIR N/A 03/06/2014   Procedure: HERNIA REPAIR EPIGASTRIC PEDIATRIC X2;  Surgeon: Judie Petit. Leonia Corona, MD;  Location: [facility];  Service: Pediatrics;  Laterality: N/A;   SCROTAL EXPLORATION Bilateral 07/08/2013   Procedure: SCROTAL EXPLORATION WITH BILATERAL ORICHIOPEXY;  Surgeon: Marcine Matar, MD;  Location: [facility];  Service: Urology;  Laterality: Bilateral;   UMBILICAL HERNIA REPAIR N/A 10/13/2020   Procedure: OPEN HERNIA REPAIR UMBILICAL ADULT WITH MESH PATCH;  Surgeon: Darnell Level, MD;  Location: [facility];  Service: General;  Laterality: N/A;   VENTRAL HERNIA REPAIR N/A 10/13/2020    Procedure: OPEN REPAIR RECURRENT HERNIA REPAIR VENTRAL ADULT WITH MESH PATCH;  Surgeon: Darnell Level, MD;  Location: [facility];  Service: General;  Laterality: N/A;    Family History  Problem Relation Age of Onset   Liver disease Father    Hypertension Mother    Asthma Mother    Asthma Brother    Diabetes Maternal Grandmother    Diabetes Maternal Grandfather     Social History   Tobacco Use   Smoking status: Never   Smokeless tobacco: Never  Vaping Use   Vaping Use: Never used  Substance Use Topics   Alcohol use: Yes    Alcohol/week: 4.0 standard drinks of alcohol    Types: 4 Shots of liquor per week    Comment: Every other day   Drug use: Never    ROS   Objective:   Vitals: BP 128/89 (BP Location: Right Arm)   Pulse 74   Temp 99.3 F (37.4 C) (Oral)   Resp 16   SpO2 98%   Physical Exam Constitutional:      General: He is not in acute distress.    Appearance: Normal appearance. He is well-developed and normal weight. He is not ill-appearing, toxic-appearing or diaphoretic.  HENT:     Head: Normocephalic and atraumatic.     Right Ear: External ear normal.     Left Ear: External ear normal.     Nose: Nose normal.     Mouth/Throat:     Pharynx: Oropharynx is clear.  Eyes:     General: No scleral icterus.       Right eye: No discharge.        Left eye: No discharge.     Extraocular Movements: Extraocular movements intact.  Neck:     Meningeal: Brudzinski's sign and Kernig's sign absent.  Cardiovascular:     Rate and Rhythm: Normal rate.  Pulmonary:     Effort: Pulmonary effort is normal.  Musculoskeletal:     Cervical back: Normal range of motion. Tenderness (midline, base of the neck) present. No swelling, edema, deformity, erythema, signs of trauma, lacerations, rigidity, spasms, torticollis, bony tenderness or crepitus. No pain with movement. Normal range of motion.     Thoracic back: Tenderness (midline upper portions) and bony tenderness present. No  swelling, edema, deformity, signs of trauma, lacerations or spasms. Normal range of motion. No scoliosis.     Lumbar back: No swelling, edema, deformity, signs of trauma, lacerations, spasms, tenderness or bony tenderness. Normal range of motion. Negative right straight leg raise test and negative left straight leg raise test. No scoliosis.  Neurological:     Mental Status: He is alert and oriented to person, place, and time.     Cranial Nerves: No cranial nerve deficit, dysarthria or facial asymmetry.     Motor: No weakness, abnormal muscle tone or pronator drift.     Coordination: Romberg sign negative. Coordination normal. Finger-Nose-Finger Test and Heel to [facility] Test normal. Rapid alternating movements normal.     Gait:  Gait normal.     Deep Tendon Reflexes: Reflexes normal.  Psychiatric:        Mood and Affect: Mood normal.        Behavior: Behavior normal.        Thought Content: Thought content normal.        Judgment: Judgment normal.     CT Angio Head Neck W WO CM  Result Date: 01/14/2023 CLINICAL DATA:  Severe sudden headache EXAM: CT ANGIOGRAPHY HEAD AND NECK WITH AND WITHOUT CONTRAST TECHNIQUE: Multidetector CT imaging of the head and neck was performed using the standard protocol during bolus administration of intravenous contrast. Multiplanar CT image reconstructions and MIPs were obtained to evaluate the vascular anatomy. Carotid stenosis measurements (when applicable) are obtained utilizing NASCET criteria, using the distal internal carotid diameter as the denominator. RADIATION DOSE REDUCTION: This exam was performed according to the departmental dose-optimization program which includes automated exposure control, adjustment of the mA and/or kV according to patient size and/or use of iterative reconstruction technique. CONTRAST:  75mL OMNIPAQUE IOHEXOL 350 MG/ML SOLN COMPARISON:  None Available. FINDINGS: CTA NECK FINDINGS SKELETON: There is no bony spinal canal stenosis. No lytic  or blastic lesion. OTHER NECK: Normal pharynx, larynx and major salivary glands. No cervical lymphadenopathy. Unremarkable thyroid gland. UPPER CHEST: No pneumothorax or pleural effusion. No nodules or masses. AORTIC ARCH: There is no calcific atherosclerosis of the aortic arch. There is no aneurysm, dissection or hemodynamically significant stenosis of the visualized portion of the aorta. Conventional 3 vessel aortic branching pattern. The visualized proximal subclavian arteries are widely patent. RIGHT CAROTID SYSTEM: Normal without aneurysm, dissection or stenosis. LEFT CAROTID SYSTEM: Normal without aneurysm, dissection or stenosis. VERTEBRAL ARTERIES: Left dominant configuration. Both origins are clearly patent. There is no dissection, occlusion or flow-limiting stenosis to the skull base (V1-V3 segments). CTA HEAD FINDINGS POSTERIOR CIRCULATION: --Vertebral arteries: Normal V4 segments. --Inferior cerebellar arteries: Normal. --Basilar artery: Normal. --Superior cerebellar arteries: Normal. --Posterior cerebral arteries (PCA): Normal. ANTERIOR CIRCULATION: --Intracranial internal carotid arteries: Normal. --Anterior cerebral arteries (ACA): Normal. Both A1 segments are present. Patent anterior communicating artery (a-comm). --Middle cerebral arteries (MCA): Normal. VENOUS SINUSES: As permitted by contrast timing, patent. ANATOMIC VARIANTS: None Review of the MIP images confirms the above findings. IMPRESSION: Normal CTA of the head and neck. Electronically Signed   By: Deatra Robinson M.D.   On: 01/14/2023 00:42   CT Angio Chest PE W and/or Wo Contrast  Result Date: 01/13/2023 CLINICAL DATA:  Near syncope. EXAM: CT ANGIOGRAPHY CHEST WITH CONTRAST TECHNIQUE: Multidetector CT imaging of the chest was performed using the standard protocol during bolus administration of intravenous contrast. Multiplanar CT image reconstructions and MIPs were obtained to evaluate the vascular anatomy. RADIATION DOSE REDUCTION:  This exam was performed according to the departmental dose-optimization program which includes automated exposure control, adjustment of the mA and/or kV according to patient size and/or use of iterative reconstruction technique. CONTRAST:  75mL OMNIPAQUE IOHEXOL 350 MG/ML SOLN COMPARISON:  January 26, 2013 FINDINGS: Cardiovascular: The thoracic aorta is normal in appearance. Satisfactory opacification of the pulmonary arteries to the segmental level. No evidence of pulmonary embolism. Normal heart size. No pericardial effusion. Mediastinum/Nodes: No enlarged mediastinal, hilar, or axillary lymph nodes. Thyroid gland, trachea, and esophagus demonstrate no significant findings. Lungs/Pleura: Lungs are clear. No pleural effusion or pneumothorax. Upper Abdomen: A 2 mm nonobstructing renal calculus is seen within the lower pole of the right kidney. Musculoskeletal: No chest wall abnormality. No acute or significant osseous findings.  Review of the MIP images confirms the above findings. IMPRESSION: 1. No evidence of pulmonary embolism or other acute intrathoracic process. 2. 2 mm nonobstructing right renal calculus. Electronically Signed   By: Aram Candela M.D.   On: 01/13/2023 23:03   CT Head Wo Contrast  Result Date: 01/13/2023 CLINICAL DATA:  Headache. EXAM: CT HEAD WITHOUT CONTRAST TECHNIQUE: Contiguous axial images were obtained from the base of the skull through the vertex without intravenous contrast. RADIATION DOSE REDUCTION: This exam was performed according to the departmental dose-optimization program which includes automated exposure control, adjustment of the mA and/or kV according to patient size and/or use of iterative reconstruction technique. COMPARISON:  None Available. FINDINGS: Brain: No evidence of acute infarction, hemorrhage, hydrocephalus, extra-axial collection or mass lesion/mass effect. Vascular: No hyperdense vessel or unexpected calcification. Skull: Normal. Negative for fracture or  focal lesion. Sinuses/Orbits: No acute finding. Other: None. IMPRESSION: No acute intracranial pathology. Electronically Signed   By: Aram Candela M.D.   On: 01/13/2023 22:03   DG Chest 2 View  Result Date: 01/13/2023 CLINICAL DATA:  Dizzy lightheaded and nauseous after playing basketball. EXAM: CHEST - 2 VIEW COMPARISON:  September 04, 2020 FINDINGS: The heart size and mediastinal contours are within normal limits. Both lungs are clear. The visualized skeletal structures are unremarkable. IMPRESSION: No active cardiopulmonary disease. Electronically Signed   By: Aram Candela M.D.   On: 01/13/2023 22:02     Results for orders placed or performed during the hospital encounter of 01/13/23 (from the past 72 hour(s))  Urinalysis, Routine w reflex microscopic -Urine, Clean Catch     Status: Abnormal   Collection Time: 01/13/23  3:49 PM  Result Value Ref Range   Color, Urine YELLOW YELLOW   APPearance HAZY (A) CLEAR   Specific Gravity, Urine 1.027 1.005 - 1.030   pH 5.0 5.0 - 8.0   Glucose, UA NEGATIVE NEGATIVE mg/dL   Hgb urine dipstick NEGATIVE NEGATIVE   Bilirubin Urine NEGATIVE NEGATIVE   Ketones, ur 20 (A) NEGATIVE mg/dL   Protein, ur 30 (A) NEGATIVE mg/dL   Nitrite NEGATIVE NEGATIVE   Leukocytes,Ua NEGATIVE NEGATIVE   RBC / HPF 11-20 0 - 5 RBC/hpf   WBC, UA 0-5 0 - 5 WBC/hpf   Bacteria, UA NONE SEEN NONE SEEN   Squamous Epithelial / HPF 0-5 0 - 5 /HPF   Mucus PRESENT    Granular Casts, UA PRESENT     Comment: Performed at [facility], [address], [place], [place] [address]  Rapid urine drug screen (hospital performed)     Status: None   Collection Time: 01/13/23  3:49 PM  Result Value Ref Range   Opiates NONE DETECTED NONE DETECTED   Cocaine NONE DETECTED NONE DETECTED   Benzodiazepines NONE DETECTED NONE DETECTED   Amphetamines NONE DETECTED NONE DETECTED   Tetrahydrocannabinol NONE DETECTED NONE DETECTED   Barbiturates NONE DETECTED NONE DETECTED    Comment:  (NOTE) DRUG SCREEN FOR MEDICAL PURPOSES ONLY.  IF CONFIRMATION IS NEEDED FOR ANY PURPOSE, NOTIFY LAB WITHIN 5 DAYS.  LOWEST DETECTABLE LIMITS FOR URINE DRUG SCREEN Drug Class                     Cutoff (ng/mL) Amphetamine and metabolites    1000 Barbiturate and metabolites    200 Benzodiazepine                 200 Opiates and metabolites  300 Cocaine and metabolites        300 THC                            50 Performed at [facility], [address] [place], [place] [address]   Basic metabolic panel     Status: Abnormal   Collection Time: 01/13/23  3:52 PM  Result Value Ref Range   Sodium 140 135 - 145 mmol/L   Potassium 3.3 (L) 3.5 - 5.1 mmol/L   Chloride 104 98 - 111 mmol/L   CO2 23 22 - 32 mmol/L   Glucose, Bld 119 (H) 70 - 99 mg/dL    Comment: Glucose reference range applies only to samples taken after fasting for at least 8 hours.   BUN 16 6 - 20 mg/dL   Creatinine, Ser 6.04 (H) 0.61 - 1.24 mg/dL   Calcium 9.5 8.9 - 54.0 mg/dL   GFR, Estimated >98 >11 mL/min    Comment: (NOTE) Calculated using the CKD-EPI Creatinine Equation (2021)    Anion gap 13 5 - 15    Comment: Performed at [facility], [address], [place], [place] [address]  CBC     Status: Abnormal   Collection Time: 01/13/23  3:52 PM  Result Value Ref Range   WBC 14.1 (H) 4.0 - 10.5 K/uL   RBC 5.12 4.22 - 5.81 MIL/uL   Hemoglobin 15.2 13.0 - 17.0 g/dL   HCT 29.5 62.1 - 30.8 %   MCV 86.9 80.0 - 100.0 fL   MCH 29.7 26.0 - 34.0 pg   MCHC 34.2 30.0 - 36.0 g/dL   RDW 65.7 84.6 - 96.2 %   Platelets 176 150 - 400 K/uL   nRBC 0.0 0.0 - 0.2 %    Comment: Performed at [facility], [address], [place], [place] [address]  Troponin I (High Sensitivity)     Status: Abnormal   Collection Time: 01/13/23  3:52 PM  Result Value Ref Range   Troponin I (High Sensitivity) 18 (H) <18 ng/L    Comment: (NOTE) Elevated high sensitivity troponin I (hsTnI) values and significant  changes  across serial measurements may suggest ACS but many other  chronic and acute conditions are known to elevate hsTnI results.  Refer to the "Links" section for chest pain algorithms and additional  guidance. Performed at [facility], [address] [place], [place] [address]   Ethanol     Status: None   Collection Time: 01/13/23  3:52 PM  Result Value Ref Range   Alcohol, Ethyl (B) <10 <10 mg/dL    Comment: (NOTE) Lowest detectable limit for serum alcohol is 10 mg/dL.  For medical purposes only. Performed at [facility], [address], [place], [place] [address]   CBG monitoring, ED     Status: Abnormal   Collection Time: 01/13/23  4:11 PM  Result Value Ref Range   Glucose-Capillary 126 (H) 70 - 99 mg/dL    Comment: Glucose reference range applies only to samples taken after fasting for at least 8 hours.  Troponin I (High Sensitivity)     Status: Abnormal   Collection Time: 01/13/23  6:22 PM  Result Value Ref Range   Troponin I (High Sensitivity) 18 (H) <18 ng/L    Comment: (NOTE) Elevated high sensitivity troponin I (hsTnI) values and significant  changes across serial measurements may suggest ACS but many  other  chronic and acute conditions are known to elevate hsTnI results.  Refer to the "Links" section for chest pain algorithms and additional  guidance. Performed at [facility], [address] [place], [place] [address]   D-dimer, quantitative     Status: Abnormal   Collection Time: 01/13/23  9:04 PM  Result Value Ref Range   D-Dimer, Quant 1.51 (H) 0.00 - 0.50 ug/mL-FEU    Comment: (NOTE) At the manufacturer cut-off value of 0.5 g/mL FEU, this assay has a negative predictive value of 95-100%.This assay is intended for use in conjunction with a clinical pretest probability (PTP) assessment model to exclude pulmonary embolism (PE) and deep venous thrombosis (DVT) in outpatients suspected of PE or DVT. Results should be correlated with clinical  presentation. Performed at [facility], [address], [place], [place] [address]      Assessment and Plan :   PDMP not reviewed this encounter.  1. Acute nonintractable headache, unspecified headache type   2. Neck pain   3. Leukocytosis, unspecified type     Workup reviewed thoroughly with patient and his mother.  Unfortunately, patient remains symptomatic and requires a higher level of testing and care than we can provide in the urgent care setting.  Differential does include meningitis, discitis.  Despite that he just had negative CT imaging of the brain, he did have leukocytosis that warrants a recheck as well as consideration for lumbar puncture, MRI and/or repeat CT imaging.  Discussed this with patient and his mother and they are agreeable to presenting to the emergency room by personal vehicle.  Patient is hemodynamically stable and appropriate for discharge.   Wallis Bamberg, [place] 01/16/23 1133

## 2023-01-20 ENCOUNTER — Encounter: Payer: Self-pay | Admitting: Family Medicine

## 2023-01-20 ENCOUNTER — Ambulatory Visit (INDEPENDENT_AMBULATORY_CARE_PROVIDER_SITE_OTHER): Payer: 59 | Admitting: Family Medicine

## 2023-01-20 ENCOUNTER — Ambulatory Visit (HOSPITAL_COMMUNITY)
Admission: RE | Admit: 2023-01-20 | Discharge: 2023-01-20 | Disposition: A | Discharge status: Home / Self Care | Payer: 59 | Source: Ambulatory Visit | Attending: Family Medicine | Admitting: Family Medicine

## 2023-01-20 VITALS — BP 132/91 | HR 74 | Temp 97.5°F | Ht 75.0 in | Wt 172.2 lb

## 2023-01-20 DIAGNOSIS — J45909 Unspecified asthma, uncomplicated: Secondary | ICD-10-CM | POA: Diagnosis not present

## 2023-01-20 DIAGNOSIS — F419 Anxiety disorder, unspecified: Secondary | ICD-10-CM

## 2023-01-20 DIAGNOSIS — F410 Panic disorder [episodic paroxysmal anxiety] without agoraphobia: Secondary | ICD-10-CM

## 2023-01-20 DIAGNOSIS — J309 Allergic rhinitis, unspecified: Secondary | ICD-10-CM

## 2023-01-20 DIAGNOSIS — R519 Headache, unspecified: Secondary | ICD-10-CM

## 2023-01-20 MED ORDER — CITALOPRAM HYDROBROMIDE 20 MG PO TABS
20.0000 mg | ORAL_TABLET | Freq: Every day | ORAL | 3 refills | Status: DC
Start: 2023-01-20 — End: 2023-05-30

## 2023-01-20 MED ORDER — KETOROLAC TROMETHAMINE 60 MG/2ML IM SOLN
60.0000 mg | Freq: Once | INTRAMUSCULAR | Status: AC
Start: 2023-01-20 — End: 2023-01-20
  Administered 2023-01-20: 60 mg via INTRAMUSCULAR

## 2023-01-20 MED ORDER — ALBUTEROL SULFATE HFA 108 (90 BASE) MCG/ACT IN AERS: 2.0000 | INHALATION_SPRAY | Freq: Four times a day (QID) | RESPIRATORY_TRACT | 5 refills | Status: DC | PRN

## 2023-01-20 MED ORDER — FLUTICASONE PROPIONATE 50 MCG/ACT NA SUSP: 1.0000 | Freq: Every day | NASAL | 1 refills | Status: DC

## 2023-01-20 MED ORDER — SUMATRIPTAN SUCCINATE 50 MG PO TABS: 50.0000 mg | ORAL_TABLET | ORAL | 0 refills | Status: AC | PRN

## 2023-01-20 MED ORDER — KETOROLAC TROMETHAMINE 10 MG PO TABS: 10.0000 mg | ORAL_TABLET | Freq: Four times a day (QID) | ORAL | 0 refills | Status: DC | PRN

## 2023-01-20 NOTE — Progress Notes (Signed)
Jeffrey Rangel is a 27 y.o. male who presents today for an office visit.  He is a new patient.   Assessment/Plan:  New/Acute Problems: Headache Physical exam notable for leftward horizontal nystagmus but otherwise negative.  His recent CT scans have been reassuring.  Based on description it is possible that this could be a migraine precipitated by his recent COVID infection.  He did had modest improvement with Toradol in the ED.  Will repeat Toradol injection today with 60 mg IM.  He will start Toradol 10 mg by mouth for the next 5 days.  Will also start Imitrex empirically.  Due to the chronicity and severity of headache as well as other neurologic findings would be reasonable to pursue further imaging at this point.  We did discuss referral to neurology however we will obtain brain MRI first.  He will follow-up with me in a week or so.  May need referral to neurology depending on response to above and MRI results.   Covid  Symptoms are very mild and are improving the last few days.  Outside of window for treatment at this point with antivirals.  He will continue conservative management.   Chronic Problems Addressed Today: Anxiety Patient with high levels of underlying anxiety for quite a while and worsening the last month or so.  We discussed treatment options.  He is not interested in starting medication to help with this.  Will start Celexa 20 mg daily.  Discussed potential side effects.  He can follow-up with me in a week and then we will discuss titrating the dose from there.  We discussed reasons return to care earlier.  Panic disorder See anxiety A/P.  Will be starting Celexa.  We did discuss referral to therapy however he is only had 2 or 3 panic episodes in his life and does not wish to pursue this further at this point.  Allergic rhinitis On Flonase and Zyrtec.  Will refill today.  Reactive airway disease Uses albuterol as needed.  Will refill today.     Subjective:   HPI:  See A/P for status chronic conditions.  Patient is here to establish care as a new patient.  His main concern is ED follow-up for headache and dizziness.  Went to the ED a week ago after sudden onset headache and dizziness.  He was [place]utside playing basketball when he started to feel off balance with severe sudden headache as well as nausea, diaphoresis, shortness of breath.  Tried to drive home however symptoms worsened. Thought he may be having anxiety attack and tried to calm himself down however this did not work.  Had several episodes of vomiting and passed out in the car. Went to the Emergency Department via private vehicle. In the ED had workup including which was significant for mildly elevated D-dimer.  Subsequently got a CTA chest which was negative.  He also received imaging with CT head and CT angiogram of head and neck.  This was negative. He was given fluids and Toradol and symptoms improved.  He was discharged home.  Unfortunately, symptoms did not fully resolve and he went back to urgent care and back to the ED 3 days later.  Had repeat CT scan which was negative.    Over the last few days he is still having headache and neck pain, predominantly on the left side.  He also notes he has been having a lot more congestion and drainage for the last week or so as well.  Took a home COVID test yesterday which was faintly positive.  He still feels off balance and dizzy.  Sometimes feel like he is going to pass out.  Worse with certain head motions.  Congestion seems to be improving.  He saw his eye doctor yesterday and had an updated prescription however he was told that this was not the cause of his symptoms.  He has had migraines in the past with previous COVID episode.  No chest pain or shortness of breath.  No numbness or tingling.  No weakness.  No photophobia.  No phonophobia.  Symptoms do seem to be slightly improving the last couple of days.  He is also concerned about his underlying  anxiety.  This is been worsening the last few weeks. He has had a few panic episodes in the past.  Does feel like he has baseline high levels of anxiety is potentially interested in starting medications for this.  No active SI or HI.  No depressive symptoms.  ROS: Per HPI, otherwise a complete review of systems was negative.   PMH:  The following were reviewed and entered/updated in epic: Past Medical History:  Diagnosis Date   Bronchitis    hx of    Epigastric hernia 02/2014   GERD (gastroesophageal reflux disease)    Patient Active Problem List   Diagnosis Date Noted   Panic disorder 01/20/2023   Anxiety 01/20/2023   Allergic rhinitis 01/20/2023   Reactive airway disease 01/20/2023   Past Surgical History:  Procedure Laterality Date   EPIGASTRIC HERNIA REPAIR N/A 03/06/2014   Procedure: HERNIA REPAIR EPIGASTRIC PEDIATRIC X2;  Surgeon: Judie Petit. Leonia Corona, MD;  Location: [facility];  Service: Pediatrics;  Laterality: N/A;   SCROTAL EXPLORATION Bilateral 07/08/2013   Procedure: SCROTAL EXPLORATION WITH BILATERAL ORICHIOPEXY;  Surgeon: Marcine Matar, MD;  Location: [facility] OR;  Service: Urology;  Laterality: Bilateral;   UMBILICAL HERNIA REPAIR N/A 10/13/2020   Procedure: OPEN HERNIA REPAIR UMBILICAL ADULT WITH MESH PATCH;  Surgeon: Darnell Level, MD;  Location: [facility];  Service: General;  Laterality: N/A;   VENTRAL HERNIA REPAIR N/A 10/13/2020   Procedure: OPEN REPAIR RECURRENT HERNIA REPAIR VENTRAL ADULT WITH MESH PATCH;  Surgeon: Darnell Level, MD;  Location: [facility];  Service: General;  Laterality: N/A;    Family History  Problem Relation Age of Onset   Hypertension Mother    Asthma Mother    Liver disease Father    Asthma Brother    Diabetes Maternal Grandmother    Diabetes Maternal Grandfather     Medications- reviewed and updated Current Outpatient Medications  Medication Sig Dispense Refill   cetirizine (ZYRTEC ALLERGY) 10 MG tablet Take 1 tablet (10 mg  total) by mouth at bedtime. 90 tablet 1   citalopram (CELEXA) 20 MG tablet Take 1 tablet (20 mg total) by mouth daily. 30 tablet 3   ibuprofen (ADVIL) 600 MG tablet Take 1 tablet (600 mg total) by mouth every 6 (six) hours as needed. 30 tablet 0   ketorolac (TORADOL) 10 MG tablet Take 1 tablet (10 mg total) by mouth every 6 (six) hours as needed. 20 tablet 0   SUMAtriptan (IMITREX) 50 MG tablet Take 1 tablet (50 mg total) by mouth every 2 (two) hours as needed for migraine. May repeat in 2 hours if headache persists or recurs. 10 tablet 0   albuterol (VENTOLIN HFA) 108 (90 Base) MCG/ACT inhaler Inhale 2 puffs into the lungs every 6 (six) hours as needed for wheezing or  shortness of breath (Cough). 18 g 5   fluticasone (FLONASE) 50 MCG/ACT nasal spray Place 1 spray into both nostrils daily. 47.4 mL 1   No current facility-administered medications for this visit.    Allergies-reviewed and updated Allergies  Allergen Reactions   Other     "green peppers"    Social History   Socioeconomic History   Marital status: Single    Spouse name: Not on file   Number of children: Not on file   Years of education: Not on file   Highest education level: Not on file  Occupational History   Not on file  Tobacco Use   Smoking status: Never   Smokeless tobacco: Never  Vaping Use   Vaping Use: Never used  Substance and Sexual Activity   Alcohol use: Yes    Alcohol/week: 4.0 standard drinks of alcohol    Types: 4 Shots of liquor per week    Comment: occ   Drug use: Never   Sexual activity: Yes  Other Topics Concern   Not on file  Social History Narrative   Not on file   Social Determinants of Health   Financial Resource Strain: Not on file  Food Insecurity: Not on file  Transportation Needs: Not on file  Physical Activity: Not on file  Stress: Not on file  Social Connections: Not on file          Objective:  Physical Exam: BP (!) 132/91   Pulse 74   Temp (!) 97.5 F (36.4 C)  (Temporal)   Ht 6\' 3"  (1.905 m)   Wt 172 lb 3.2 oz (78.1 kg)   SpO2 97%   BMI 21.52 kg/m   Gen: No acute distress, resting comfortably CV: Regular rate and rhythm with no murmurs appreciated Pulm: Normal work of breathing, clear to auscultation bilaterally with no crackles, wheezes, or rhonchi Neuro: Leftward horizontal nystagmus noted otherwise cranial nerves II through XII intact. Psych: Normal affect and thought content  Time Spent: 65 minutes of total time was spent on the date of the encounter performing the following actions: chart review prior to seeing the patient including recent Emergency Department visits, obtaining history, performing a medically necessary exam, counseling on the treatment plan, placing orders, and documenting in our EHR.        Katina Degree. Jimmey Ralph, MD 01/20/2023 12:36 PM

## 2023-01-20 NOTE — Assessment & Plan Note (Signed)
See anxiety A/P.  Will be starting Celexa.  We did discuss referral to therapy however he is only had 2 or 3 panic episodes in his life and does not wish to pursue this further at this point.

## 2023-01-20 NOTE — Patient Instructions (Signed)
It was very nice to see you today!  I think your symptoms are probably due to a migraine that was exacerbated by COVID.  We will check an MRI to make sure there is nothing else that is going on.  Will give you 60 mg of Toradol today and you can start by mouth Toradol tomorrow with 2 mg every 6 hours as needed.  Please also try the Imitrex if your headache comes back.  Please try the Celexa to help you with your anxiety.  Please come back in 1 week.  Come back sooner if needed.  No follow-ups on file.   Take care, Dr Jimmey Ralph  PLEASE NOTE:  If you had any lab tests, please let us know if you have not heard back within a few days. You may see your results on mychart before we have a chance to review them but we will give you a call once they are reviewed by [place].   If we ordered any referrals today, please let us know if you have not heard from their office within the next week.   If you had any urgent prescriptions sent in today, please check with the pharmacy within an hour of our visit to make sure the prescription was transmitted appropriately.   Please try these tips to maintain a healthy lifestyle:  Eat at least 3 REAL meals and 1-2 snacks per day.  Aim for no more than 5 hours between eating.  If you eat breakfast, please do so within one hour of getting up.   Each meal should contain half fruits/vegetables, one quarter protein, and one quarter carbs (no bigger than a computer mouse)  Cut down on sweet beverages. This includes juice, soda, and sweet tea.   Drink at least 1 glass of water with each meal and aim for at least 8 glasses per day  Exercise at least 150 minutes every week.

## 2023-01-20 NOTE — Assessment & Plan Note (Signed)
Uses albuterol as needed.  Will refill today.

## 2023-01-20 NOTE — Assessment & Plan Note (Signed)
Patient with high levels of underlying anxiety for quite a while and worsening the last month or so.  We discussed treatment options.  He is not interested in starting medication to help with this.  Will start Celexa 20 mg daily.  Discussed potential side effects.  He can follow-up with me in a week and then we will discuss titrating the dose from there.  We discussed reasons return to care earlier.

## 2023-01-20 NOTE — Assessment & Plan Note (Signed)
On Flonase and Zyrtec.  Will refill today.

## 2023-01-21 NOTE — Progress Notes (Signed)
Great news! His MRI was normal.  As we discussed at his office visit, I think he is probably having migraines.  He should let us know if his symptoms did not improve with the medications we sent in.

## 2023-01-28 ENCOUNTER — Ambulatory Visit: Payer: 59 | Admitting: Family Medicine

## 2023-01-28 ENCOUNTER — Encounter: Payer: Self-pay | Admitting: Family Medicine

## 2023-01-28 VITALS — BP 124/87 | HR 88 | Temp 97.8°F | Ht 75.0 in | Wt 168.2 lb

## 2023-01-28 DIAGNOSIS — J309 Allergic rhinitis, unspecified: Secondary | ICD-10-CM

## 2023-01-28 DIAGNOSIS — Z23 Encounter for immunization: Secondary | ICD-10-CM | POA: Diagnosis not present

## 2023-01-28 DIAGNOSIS — F410 Panic disorder [episodic paroxysmal anxiety] without agoraphobia: Secondary | ICD-10-CM

## 2023-01-28 DIAGNOSIS — F419 Anxiety disorder, unspecified: Secondary | ICD-10-CM

## 2023-01-28 DIAGNOSIS — R519 Headache, unspecified: Secondary | ICD-10-CM

## 2023-01-28 HISTORY — DX: Headache, unspecified: R51.9

## 2023-01-28 NOTE — Patient Instructions (Signed)
It was very nice to see you today!  I am glad you are doing better.  Please start the Celexa.  Unknown couple weeks how you are doing.  Return in about 2 weeks (around 02/11/2023).   Take care, Dr Jimmey Ralph  PLEASE NOTE:  If you had any lab tests, please let us know if you have not heard back within a few days. You may see your results on mychart before we have a chance to review them but we will give you a call once they are reviewed by [place].   If we ordered any referrals today, please let us know if you have not heard from their office within the next week.   If you had any urgent prescriptions sent in today, please check with the pharmacy within an hour of our visit to make sure the prescription was transmitted appropriately.   Please try these tips to maintain a healthy lifestyle:  Eat at least 3 REAL meals and 1-2 snacks per day.  Aim for no more than 5 hours between eating.  If you eat breakfast, please do so within one hour of getting up.   Each meal should contain half fruits/vegetables, one quarter protein, and one quarter carbs (no bigger than a computer mouse)  Cut down on sweet beverages. This includes juice, soda, and sweet tea.   Drink at least 1 glass of water with each meal and aim for at least 8 glasses per day  Exercise at least 150 minutes every week.

## 2023-01-28 NOTE — Assessment & Plan Note (Signed)
Symptoms have mostly resolved.  Likely had migraine last week.  May have been cluster headache as well.  His neurologic exam was reassuring and MRI was negative as well.  He is still having small occasional recurrences in the morning but this does seem to be lessening in severity and frequency.  We did discuss referral to see neurology however he declined for now.  He can continue to use Imitrex as needed though advised him to not take any more than 1 or 2 times per week.  If symptoms return or anything changes will need to be referred to neurology.

## 2023-01-28 NOTE — Assessment & Plan Note (Signed)
He has not yet started the Celexa though does admit he still has quite a bit of underlying anxiety this does seem to be slightly better now that his headaches have improved.  We discussed his treatment options and he is agreeable to start Celexa to help with his baseline anxiety.  Start 20 mg daily as we discussed at his last office visit.  He will follow-up with me in a couple of weeks via MyChart and we can titrate the dose as needed.

## 2023-01-28 NOTE — Progress Notes (Signed)
   Jeffrey Rangel is a 27 y.o. male who presents today for an office visit.  Assessment/Plan:  Chronic Problems Addressed Today: Headache Symptoms have mostly resolved.  Likely had migraine last week.  May have been cluster headache as well.  His neurologic exam was reassuring and MRI was negative as well.  He is still having small occasional recurrences in the morning but this does seem to be lessening in severity and frequency.  We did discuss referral to see neurology however he declined for now.  He can continue to use Imitrex as needed though advised him to not take any more than 1 or 2 times per week.  If symptoms return or anything changes will need to be referred to neurology.  Anxiety He has not yet started the Celexa though does admit he still has quite a bit of underlying anxiety this does seem to be slightly better now that his headaches have improved.  We discussed his treatment options and he is agreeable to start Celexa to help with his baseline anxiety.  Start 20 mg daily as we discussed at his last office visit.  He will follow-up with me in a couple of weeks via MyChart and we can titrate the dose as needed.     Subjective:  HPI:  See A/P for status of chronic conditions.  Patient is here today for follow-up.  We initially saw him a week ago for new patient visit.  At that time was having ongoing issues with headache and anxiety.  We started him on Celexa 20 mg daily for his anxiety.  For his headache we did give him an injection of Toradol in the office and started a 5-day course of Toradol and also started on Imitrex for presumed migraines.  We did obtain an MRI at that time which was reassuring.  Over the last week or so headache has been gradually improving.  The Imitrex and Toradol did seem to help.  Now only has headaches occasionally in the morning.  Does not have any headache today.  He is concerned about potential allergic rhinitis and sinus headaches.  Flonase does  seem to help.  He did not start the Celexa.  Overall does still have some baseline anxiety however this does seem to be better than what was previously.       Objective:  Physical Exam: BP 124/87   Pulse 88   Temp 97.8 F (36.6 C) (Temporal)   Ht 6\' 3"  (1.905 m)   Wt 168 lb 3.2 oz (76.3 kg)   SpO2 99%   BMI 21.02 kg/m   Wt Readings from Last 3 Encounters:  01/28/23 168 lb 3.2 oz (76.3 kg)  01/20/23 172 lb 3.2 oz (78.1 kg)  01/16/23 185 lb (83.9 kg)    Gen: No acute distress, resting comfortably Neuro: Grossly normal, moves all extremities Psych: Normal affect and thought content      Jaylon Boylen M. Jimmey Ralph, MD 01/28/2023 12:30 PM

## 2023-01-31 ENCOUNTER — Other Ambulatory Visit: Payer: Self-pay | Admitting: *Deleted

## 2023-01-31 NOTE — Telephone Encounter (Signed)
Please advise 

## 2023-02-01 NOTE — Telephone Encounter (Signed)
If the yawning is causing significant problems for him then recommend he stop and check back in with [place] in a few days to make sure the side effects have resolved.  Katina Degree. Jimmey Ralph, MD 02/01/2023 8:46 AM

## 2023-02-04 ENCOUNTER — Telehealth: Payer: Self-pay | Admitting: Family Medicine

## 2023-02-04 NOTE — Telephone Encounter (Signed)
Patient states he will be dropping off Disability forms today for Dr. Jimmey Ralph to sign

## 2023-02-04 NOTE — Telephone Encounter (Signed)
Type of form received: FMLA  Additional comments:   Received by: Leodis Rains should be Faxed to: NA  Form should be mailed to:  NA  Is patient requesting call for pickup: YES   Form placed:  In Dr folder  Johnnye Sima charge sheet. YES  Individual made aware of 3-5 business day turn around (Y/N)? YES

## 2023-02-06 DIAGNOSIS — Z0279 Encounter for issue of other medical certificate: Secondary | ICD-10-CM

## 2023-02-07 NOTE — Telephone Encounter (Signed)
FMLA form placed in PCP office to be reviewed

## 2023-02-08 ENCOUNTER — Telehealth: Payer: Self-pay | Admitting: Family Medicine

## 2023-02-08 NOTE — Telephone Encounter (Signed)
Family Medical Leave Act [facility]) completed.  Katina Degree. Jimmey Ralph, MD 02/08/2023 3:10 PM

## 2023-02-08 NOTE — Telephone Encounter (Signed)
FMLA form ready to be pick up Patient notified, placed at front office  Faxed to [phone] Copy placed to be scan in Patient chart

## 2023-02-08 NOTE — Telephone Encounter (Signed)
Pt is calling back about the FMLA paperwork. Please advise.

## 2023-02-08 NOTE — Telephone Encounter (Signed)
Patient dropped off document  Short Term Disability , to be filled out by provider. Patient requested to send it via Fax within 5-days. Document is located in providers tray at front office.Please advise at Mobile [phone] (mobile)

## 2023-02-09 NOTE — Telephone Encounter (Signed)
Placed In PCP office to be reviewed

## 2023-02-09 NOTE — Telephone Encounter (Signed)
FMLA done, patient notified

## 2023-02-10 NOTE — Telephone Encounter (Signed)
Form faxed to [phone] Patient notified

## 2023-05-30 ENCOUNTER — Other Ambulatory Visit: Payer: Self-pay | Admitting: Family Medicine

## 2023-07-16 ENCOUNTER — Encounter (HOSPITAL_COMMUNITY): Payer: Self-pay

## 2023-07-16 ENCOUNTER — Ambulatory Visit (HOSPITAL_COMMUNITY)
Admission: EM | Admit: 2023-07-16 | Discharge: 2023-07-16 | Disposition: A | Discharge status: Home / Self Care | Payer: Managed Care, Other (non HMO) | Attending: Family Medicine | Admitting: Family Medicine

## 2023-07-16 DIAGNOSIS — R300 Dysuria: Secondary | ICD-10-CM | POA: Insufficient documentation

## 2023-07-16 DIAGNOSIS — Z113 Encounter for screening for infections with a predominantly sexual mode of transmission: Secondary | ICD-10-CM | POA: Insufficient documentation

## 2023-07-16 LAB — POCT URINALYSIS DIP (MANUAL ENTRY)
Glucose, UA: NEGATIVE mg/dL
Leukocytes, UA: NEGATIVE
Nitrite, UA: NEGATIVE
Spec Grav, UA: 1.025 (ref 1.010–1.025)
Urobilinogen, UA: 4 U/dL — AB
pH, UA: 6.5 (ref 5.0–8.0)

## 2023-07-16 LAB — HIV ANTIBODY (ROUTINE TESTING W REFLEX): HIV Screen 4th Generation wRfx: NONREACTIVE

## 2023-07-16 NOTE — ED Provider Notes (Addendum)
[facility]    CSN: [id number] Arrival date & time: 07/16/23  1053      History   Chief Complaint Chief Complaint  Patient presents with  . SEXUALLY TRANSMITTED DISEASE    HPI Jeffrey Rangel is a 27 y.o. male.   The history is provided by the patient. No language interpreter was used.  Dysuria Presenting symptoms: dysuria   Presenting symptoms comment:  Feels some irritation when he urinates. This started about 1 month ago. He has hx of Chlamydia in the past and this feels different from his previous symptoms. He denies any penile discharge. No recent STD exposure. Sexually active. Does use protection regularly. Wish to get tested for STD    Past Medical History:  Diagnosis Date  . Bronchitis    hx of   . 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 GERD (gastroesophageal reflux disease)   . Headache 01/28/2023    Patient Active Problem List   Diagnosis Date Noted  . Headache 01/28/2023  . Panic disorder 01/20/2023  . Anxiety 01/20/2023  . Allergic rhinitis 01/20/2023  . Reactive airway disease 01/20/2023    Past Surgical History:  Procedure Laterality Date  . EPIGASTRIC HERNIA REPAIR N/A 03/06/2014   Procedure: HERNIA REPAIR EPIGASTRIC PEDIATRIC X2;  Surgeon: Judie Petit. Leonia Corona, MD;  Location: [facility];  Service: Pediatrics;  Laterality: N/A;  . SCROTAL EXPLORATION Bilateral 07/08/2013   Procedure: SCROTAL EXPLORATION WITH BILATERAL ORICHIOPEXY;  Surgeon: Marcine Matar, MD;  Location: [facility];  Service: Urology;  Laterality: Bilateral;  . UMBILICAL HERNIA REPAIR N/A 10/13/2020   Procedure: OPEN HERNIA REPAIR UMBILICAL ADULT WITH MESH PATCH;  Surgeon: Darnell Level, MD;  Location: [facility];  Service: General;  Laterality: N/A;  . VENTRAL HERNIA REPAIR N/A 10/13/2020   Procedure: OPEN REPAIR RECURRENT HERNIA REPAIR VENTRAL ADULT WITH MESH PATCH;  Surgeon: Darnell Level, MD;  Location: [facility];  Service: General;  Laterality: N/A;       Home  Medications    Prior to Admission medications   Medication Sig Start Date End Date Taking? Authorizing Provider  citalopram (CELEXA) 20 MG tablet TAKE 1 TABLET BY MOUTH EVERY DAY 05/30/23  Yes Ardith Dark, MD  albuterol (VENTOLIN HFA) 108 (90 Base) MCG/ACT inhaler Inhale 2 puffs into the lungs every 6 (six) hours as needed for wheezing or shortness of breath (Cough). 01/20/23   Ardith Dark, MD  cetirizine (ZYRTEC ALLERGY) 10 MG tablet Take 1 tablet (10 mg total) by mouth at bedtime. 08/30/22 02/26/23  Theadora Rama Scales, PA-C  fluticasone (FLONASE) 50 MCG/ACT nasal spray Place 1 spray into both nostrils daily. 01/20/23   Ardith Dark, MD  ibuprofen (ADVIL) 600 MG tablet Take 1 tablet (600 mg total) by mouth every 6 (six) hours as needed. 01/14/23   Arby Barrette, MD  ketorolac (TORADOL) 10 MG tablet Take 1 tablet (10 mg total) by mouth every 6 (six) hours as needed. 01/20/23   Ardith Dark, MD  SUMAtriptan (IMITREX) 50 MG tablet Take 1 tablet (50 mg total) by mouth every 2 (two) hours as needed for migraine. May repeat in 2 hours if headache persists or recurs. 01/20/23   Ardith Dark, MD    Family History Family History  Problem Relation Age of Onset  . Hypertension Mother   . Asthma Mother   . Liver disease Father   . Asthma Brother   . Diabetes Maternal Grandmother   . Diabetes Maternal Grandfather  Social History Social History   Tobacco Use  . Smoking status: Never  . Smokeless tobacco: Never  Vaping Use  . Vaping status: Never Used  Substance Use Topics  . Alcohol use: Yes    Alcohol/week: 4.0 standard drinks of alcohol    Types: 4 Shots of liquor per week    Comment: occ  . Drug use: Never     Allergies   Other   Review of Systems Review of Systems  Genitourinary:  Positive for dysuria.     Physical Exam Triage Vital Signs ED Triage Vitals  Encounter Vitals Group     BP 07/16/23 1150 124/84     Systolic BP Percentile --      Diastolic BP  Percentile --      Pulse Rate 07/16/23 1150 69     Resp 07/16/23 1150 16     Temp 07/16/23 1150 98.2 F (36.8 C)     Temp Source 07/16/23 1150 Oral     SpO2 07/16/23 1150 98 %     Weight 07/16/23 1150 190 lb (86.2 kg)     Height 07/16/23 1150 6\' 5"  (1.956 m)     Head Circumference --      Peak Flow --      Pain Score 07/16/23 1148 0     Pain Loc --      Pain Education --      Exclude from Growth Chart --    No data found.  Updated Vital Signs BP 124/84 (BP Location: Left Arm)   Pulse 69   Temp 98.2 F (36.8 C) (Oral)   Resp 16   Ht 6\' 5"  (1.956 m)   Wt 86.2 kg   SpO2 98%   BMI 22.53 kg/m   Visual Acuity Right Eye Distance:   Left Eye Distance:   Bilateral Distance:    Right Eye Near:   Left Eye Near:    Bilateral Near:     Physical Exam Vitals and nursing note reviewed.  Cardiovascular:     Rate and Rhythm: Normal rate and regular rhythm.     Heart sounds: Normal heart sounds. No murmur heard. Pulmonary:     Effort: Pulmonary effort is normal. No respiratory distress.     Breath sounds: Normal breath sounds. No wheezing.  Abdominal:     General: Abdomen is flat. Bowel sounds are normal. There is no distension.     Palpations: Abdomen is soft. There is no mass.     Tenderness: There is no abdominal tenderness.      UC Treatments / Results  Labs (all labs ordered are listed, but only abnormal results are displayed) Labs Reviewed  POCT URINALYSIS DIP (MANUAL ENTRY) - Abnormal; Notable for the following components:      Result Value   Bilirubin, UA small (*)    Ketones, POC UA trace (5) (*)    Blood, UA moderate (*)    Protein Ur, POC trace (*)    Urobilinogen, UA 4.0 (*)    All other components within normal limits  URINE CULTURE  HIV ANTIBODY (ROUTINE TESTING W REFLEX)  RPR  CYTOLOGY, (ORAL, ANAL, URETHRAL) ANCILLARY ONLY    EKG   Radiology No results found.  Procedures Procedures (including critical care time)  Medications Ordered in  UC Medications - No data to display  Initial Impression / Assessment and Plan / UC Course  I have reviewed the triage vital signs and the nursing notes.  Pertinent labs & imaging results  that were available during my care of the patient were reviewed by me and considered in my medical decision making (see chart for details).  Clinical Course as of 07/16/23 1239  Sat Jul 16, 2023  1221 Dysuria - concern for STD UA checked and will send urine culture as well STD screening for GC/Chlamydia, HIV and RPR collected I will contact him soon with all test results and treat if needed UA pending at this time. [KE]  1238 Addendum: UA report review Result note sent via MyChart  Hello Jamyron, your urine test is positive for blood and bilirubin. Sometimes, this could be due to infection, although your urine does not suggest bladder infection. If your urine specimen was given after urethral swab, it could explain blood in the urine.  Despite this, we send your urine for culture. The STD screening is pending.  [KE]    Clinical Course User Index [KE] Doreene Eland, MD    Final Clinical Impressions(s) / UC Diagnoses   Final diagnoses:  Dysuria  Screen for STD (sexually transmitted disease)     Discharge Instructions      It was nice meeting you. I will call soon with your test results.     ED Prescriptions   None    PDMP not reviewed this encounter.   Doreene Eland, MD 07/16/23 1223    Doreene Eland, MD 07/16/23 1239

## 2023-07-16 NOTE — ED Triage Notes (Signed)
Patient here today to be tested for all STDs. Patient states that he is feeling fine now but he did notice that he had some discomfort when he was urinating before.

## 2023-07-16 NOTE — Discharge Instructions (Signed)
It was nice meeting you. I will call soon with your test results.

## 2023-07-17 LAB — URINE CULTURE: Culture: NO GROWTH

## 2023-07-17 LAB — RPR: RPR Ser Ql: NONREACTIVE

## 2023-07-18 ENCOUNTER — Other Ambulatory Visit: Payer: Self-pay | Admitting: Family Medicine

## 2023-07-18 LAB — CYTOLOGY, (ORAL, ANAL, URETHRAL) ANCILLARY ONLY
Chlamydia: NEGATIVE
Comment: NEGATIVE
Comment: NEGATIVE
Comment: NORMAL
Neisseria Gonorrhea: NEGATIVE
Trichomonas: POSITIVE — AB

## 2023-07-18 MED ORDER — METRONIDAZOLE 500 MG PO TABS: 500.0000 mg | ORAL_TABLET | Freq: Two times a day (BID) | ORAL | 0 refills | Status: AC

## 2023-07-18 NOTE — Telephone Encounter (Signed)
MyChart message sent about test result. Flagyl escribed.

## 2023-07-24 ENCOUNTER — Encounter: Payer: Self-pay | Admitting: Family Medicine

## 2023-07-25 NOTE — Telephone Encounter (Signed)
Please advise 

## 2023-07-25 NOTE — Telephone Encounter (Signed)
He was started on this at urgent care. Recommend he wait 24-48 hours after finishing antibiotics at least.  Katina Degree. Jimmey Ralph, MD 07/25/2023 1:00 PM

## 2023-08-24 HISTORY — PX: KNEE SURGERY: SHX244

## 2023-11-02 ENCOUNTER — Ambulatory Visit: Admitting: Family Medicine

## 2023-11-02 ENCOUNTER — Telehealth: Admitting: Physician Assistant

## 2023-11-02 DIAGNOSIS — J019 Acute sinusitis, unspecified: Secondary | ICD-10-CM

## 2023-11-02 DIAGNOSIS — B9789 Other viral agents as the cause of diseases classified elsewhere: Secondary | ICD-10-CM

## 2023-11-02 MED ORDER — AZELASTINE HCL 0.1 % NA SOLN: 1.0000 | Freq: Two times a day (BID) | NASAL | 0 refills | Status: DC

## 2023-11-02 NOTE — Progress Notes (Signed)
 I have spent 5 minutes in review of e-visit questionnaire, review and updating patient chart, medical decision making and response to patient.   Piedad Climes, PA-C

## 2023-11-02 NOTE — Progress Notes (Signed)
 E-Visit for Sinus Problems  We are sorry that you are not feeling well.  Here is how we plan to help!  Based on what you have shared with me it looks like you have sinusitis.  Sinusitis is inflammation and infection in the sinus cavities of the head.  Based on your presentation I believe you most likely have Acute Viral Sinusitis.This is an infection most likely caused by a virus. There is not specific treatment for viral sinusitis other than to help you with the symptoms until the infection runs its course.  You may use an oral decongestant such as Mucinex D or if you have glaucoma or high blood pressure use plain Mucinex. Saline nasal spray help and can safely be used as often as needed for congestion, I have prescribed: Azelastine nasal spray 2 sprays in each nostril twice a day. You can use this along with OTC Nasonex/Nasacort  Some authorities believe that zinc sprays or the use of Echinacea may shorten the course of your symptoms.  Sinus infections are not as easily transmitted as other respiratory infection, however we still recommend that you avoid close contact with loved ones, especially the very young and elderly.  Remember to wash your hands thoroughly throughout the day as this is the number one way to prevent the spread of infection!  Home Care: Only take medications as instructed by your medical team. Do not take these medications with alcohol. A steam or ultrasonic humidifier can help congestion.  You can place a towel over your head and breathe in the steam from hot water coming from a faucet. Avoid close contacts especially the very young and the elderly. Cover your mouth when you cough or sneeze. Always remember to wash your hands.  Get Help Right Away If: You develop worsening fever or sinus pain. You develop a severe head ache or visual changes. Your symptoms persist after you have completed your treatment plan.  Make sure you Understand these instructions. Will watch  your condition. Will get help right away if you are not doing well or get worse.   Thank you for choosing an e-visit.  Your e-visit answers were reviewed by a board certified advanced clinical practitioner to complete your personal care plan. Depending upon the condition, your plan could have included both over the counter or prescription medications.  Please review your pharmacy choice. Make sure the pharmacy is open so you can pick up prescription now. If there is a problem, you may contact your provider through [employer] and have the prescription routed to another pharmacy.  Your safety is important to [place]. If you have drug allergies check your prescription carefully.   For the next 24 hours you can use MyChart to ask questions about today's visit, request a non-urgent call back, or ask for a work or school excuse. You will get an email in the next two days asking about your experience. I hope that your e-visit has been valuable and will speed your recovery.

## 2024-01-13 ENCOUNTER — Other Ambulatory Visit: Payer: Self-pay

## 2024-01-13 ENCOUNTER — Encounter (HOSPITAL_COMMUNITY): Payer: Self-pay

## 2024-01-13 ENCOUNTER — Emergency Department (HOSPITAL_COMMUNITY)

## 2024-01-13 ENCOUNTER — Emergency Department (HOSPITAL_COMMUNITY)
Admission: EM | Admit: 2024-01-13 | Discharge: 2024-01-13 | Disposition: A | Discharge status: Home / Self Care | Attending: Emergency Medicine | Admitting: Emergency Medicine

## 2024-01-13 DIAGNOSIS — M25561 Pain in right knee: Secondary | ICD-10-CM | POA: Insufficient documentation

## 2024-01-13 NOTE — ED Provider Notes (Signed)
 [place] EMERGENCY DEPARTMENT AT [facility] Provider Note   CSN: [id number] Arrival date & time: 01/13/24  1502     History  Chief Complaint  Patient presents with   Knee Pain    Jeffrey Rangel is a 28 y.o. male.  28 year old male presents today for concern of right knee pain.  He has a known injury to the right knee and is following with sports medicine and is due to undergo MRI on 6/2.  He has had previous x-ray and CT scan done.  However he states 2 days ago his knee gave out on him.  Denies any other injuries during this event.  States he had some swelling and worsening pain since this incident.  The history is provided by the patient. No language interpreter was used.       Home Medications Prior to Admission medications   Medication Sig Start Date End Date Taking? Authorizing Provider  albuterol  (VENTOLIN  HFA) 108 (90 Base) MCG/ACT inhaler Inhale 2 puffs into the lungs every 6 (six) hours as needed for wheezing or shortness of breath (Cough). 01/20/23   Rodney Clamp, MD  azelastine  (ASTELIN ) 0.1 % nasal spray Place 1 spray into both nostrils 2 (two) times daily. Use in each nostril as directed 11/02/23   Farris Hong, PA-C  cetirizine  (ZYRTEC  ALLERGY) 10 MG tablet Take 1 tablet (10 mg total) by mouth at bedtime. 08/30/22 02/26/23  Eloise Hake Scales, PA-C  citalopram  (CELEXA ) 20 MG tablet TAKE 1 TABLET BY MOUTH EVERY DAY 05/30/23   Rodney Clamp, MD  fluticasone  (FLONASE ) 50 MCG/ACT nasal spray Place 1 spray into both nostrils daily. 01/20/23   Rodney Clamp, MD  ibuprofen  (ADVIL ) 600 MG tablet Take 1 tablet (600 mg total) by mouth every 6 (six) hours as needed. 01/14/23   Wynetta Heckle, MD  ketorolac  (TORADOL ) 10 MG tablet Take 1 tablet (10 mg total) by mouth every 6 (six) hours as needed. 01/20/23   Rodney Clamp, MD  SUMAtriptan  (IMITREX ) 50 MG tablet Take 1 tablet (50 mg total) by mouth every 2 (two) hours as needed for migraine. May repeat in 2  hours if headache persists or recurs. 01/20/23   Rodney Clamp, MD      Allergies    Other    Review of Systems   Review of Systems  Constitutional:  Negative for fever.  Musculoskeletal:  Positive for arthralgias. Negative for joint swelling.  All other systems reviewed and are negative.   Physical Exam Updated Vital Signs BP (!) 158/95 (BP Location: Right Arm)   Pulse 95   Temp 99.1 F (37.3 C) (Oral)   Resp 18   Ht 6\' 5"  (1.956 m)   Wt 90.7 kg   SpO2 100%   BMI 23.72 kg/m  Physical Exam Vitals and nursing note reviewed.  Constitutional:      General: He is not in acute distress.    Appearance: Normal appearance. He is not ill-appearing.  HENT:     Head: Normocephalic and atraumatic.     Nose: Nose normal.  Eyes:     Conjunctiva/sclera: Conjunctivae normal.  Cardiovascular:     Rate and Rhythm: Normal rate and regular rhythm.  Pulmonary:     Effort: Pulmonary effort is normal. No respiratory distress.  Musculoskeletal:        General: No deformity. Normal range of motion.     Comments: Noted range of motion bilateral lower extremities.  Ambulates without difficulty.  No joint swelling noted.  No overlying erythema.  Skin:    Findings: No rash.  Neurological:     Mental Status: He is alert.     ED Results / Procedures / Treatments   Labs (all labs ordered are listed, but only abnormal results are displayed) Labs Reviewed - No data to display  EKG None  Radiology DG Knee Complete 4 Views Right Result Date: 01/13/2024 CLINICAL DATA:  Knee injury. EXAM: RIGHT KNEE - COMPLETE 4 VIEW COMPARISON:  None Available. FINDINGS: No evidence of fracture, dislocation, or joint effusion. No evidence of arthropathy or other focal bone abnormality. Soft tissues are unremarkable. IMPRESSION: No acute osseous abnormality. Electronically Signed   By: Adrianna Horde M.D.   On: 01/13/2024 17:53    Procedures Procedures    Medications Ordered in ED Medications - No data to  display  ED Course/ Medical Decision Making/ A&P                                 Medical Decision Making Amount and/or Complexity of Data Reviewed Radiology: ordered.   28 year old male presents today for concern of right knee pain.  He is established with sports medicine but has not seen them since this injury 2 days ago.  Will obtain x-ray to ensure that there is no acute change.  He is currently pending MRI on an outpatient basis with sports medicine.  Does have a knee immobilizer on currently.  Is not using crutches. X-ray obtained.  No acute concern.  I have personally reviewed and agree with radiology interpretation. I did advise him to reach out to his sports medicine team and let them know so they could either see him in clinic sooner or move his MRI date sooner.  He has done this while he was waiting for the x-ray.  They will attempt to move his MRI date sooner. Patient discharged in stable condition.  Advised to use crutches to offload the right knee.   Final Clinical Impression(s) / ED Diagnoses Final diagnoses:  Right knee pain, unspecified chronicity    Rx / DC Orders ED Discharge Orders     None         Lucina Sabal, PA-C 01/13/24 1824    Albertus Hughs, DO 01/13/24 2048

## 2024-01-13 NOTE — ED Triage Notes (Signed)
 Patients right knee gave out 2 days ago. Has trouble putting weight on leg. Had a work injury 2 months ago to right knee. Was told he needs MRI.

## 2024-01-13 NOTE — Discharge Instructions (Signed)
 Follow-up with your orthopedist.  With the MRI that they have ordered.  Keep the immobilizer in place and use crutches to offload the knee.  Return for any emergent symptoms.

## 2024-07-30 ENCOUNTER — Telehealth: Admitting: Family Medicine

## 2024-07-30 DIAGNOSIS — F419 Anxiety disorder, unspecified: Secondary | ICD-10-CM

## 2024-07-30 MED ORDER — AMOXICILLIN-POT CLAVULANATE 875-125 MG PO TABS: 1.0000 | ORAL_TABLET | Freq: Two times a day (BID) | ORAL | 0 refills | Status: DC

## 2024-07-30 NOTE — Progress Notes (Addendum)
   Jeffrey Rangel is a 28 y.o. male who presents today for a virtual office visit.  Assessment/Plan:  New/Acute Problems: Sinusitis  Red flags.  Discussed limitations of virtual visit and inability to perform physical exam.  History is consistent with sinusitis following a recent viral illness.  We will start Augmentin .  He can use over-the-counter meds as needed.  Encouraged hydration.  Discussed reasons return to care.  Follow-up as needed.  Chronic Problems Addressed Today: Anxiety Patient does admit that his anxiety is worsened recently.  We last saw him about a year and a half ago for this and have prescribed Celexa .  He took this for a period time however is discontinued.  Symptoms may be worsened due to recent infection.  We did discuss restarting medication versus referral to therapist however he declined.  Preventative health care Advised patient to come in sooner for annual physical with labs.    Subjective:  HPI:  See Assessment / plan for status of chronic conditions.  Patient is here today with sinus congestion.  Symptoms started a couple weeks ago.  His son was recently diagnosed with RSV and he believes that he acquired them from his son.  His symptoms were improving but have began to worsen again recently.  Associated symptoms include sinus congestion, decreased appetite, cough and a few days of fevers and chills.  Tried using over-the-counter medications without much improvement and then he feels like the nasal sprays may have worsened his symptoms.  He also has been feeling more anxious and stressed recently which seems to be happening more at night.  No current fevers or chills.       Objective/Observations  Physical Exam: Gen: NAD, resting comfortably Pulm: Normal work of breathing Neuro: Grossly normal, moves all extremities Psych: Normal affect and thought content  Virtual Visit via Video   I connected with Gina J Gregory on 07/30/24 at 10:40 AM EST by a  video enabled telemedicine application and verified that I am speaking with the correct person using two identifiers. The limitations of evaluation and management by telemedicine and the availability of in person appointments were discussed. The patient expressed understanding and agreed to proceed.   Patient location: Home Provider location: [place] [employer] Persons participating in the virtual visit: Myself and Patient     Worth HERO. Kennyth, MD 07/30/2024 11:17 AM

## 2024-07-30 NOTE — Assessment & Plan Note (Signed)
 Patient does admit that his anxiety is worsened recently.  We last saw him about a year and a half ago for this and have prescribed Celexa .  He took this for a period time however is discontinued.  Symptoms may be worsened due to recent infection.  We did discuss restarting medication versus referral to therapist however he declined.

## 2024-08-09 ENCOUNTER — Ambulatory Visit: Admitting: Family Medicine

## 2024-08-28 ENCOUNTER — Ambulatory Visit: Admitting: Family Medicine

## 2024-08-28 ENCOUNTER — Telehealth: Payer: Self-pay | Admitting: Family Medicine

## 2024-08-28 ENCOUNTER — Encounter: Payer: Self-pay | Admitting: Family Medicine

## 2024-08-28 VITALS — BP 136/82 | HR 67 | Temp 97.7°F | Ht 77.0 in | Wt 213.6 lb

## 2024-08-28 DIAGNOSIS — J45909 Unspecified asthma, uncomplicated: Secondary | ICD-10-CM

## 2024-08-28 DIAGNOSIS — F419 Anxiety disorder, unspecified: Secondary | ICD-10-CM

## 2024-08-28 DIAGNOSIS — J309 Allergic rhinitis, unspecified: Secondary | ICD-10-CM

## 2024-08-28 MED ORDER — BUSPIRONE HCL 5 MG PO TABS: 5.0000 mg | ORAL_TABLET | Freq: Two times a day (BID) | ORAL | 5 refills | Status: AC

## 2024-08-28 MED ORDER — ALBUTEROL SULFATE HFA 108 (90 BASE) MCG/ACT IN AERS: 2.0000 | INHALATION_SPRAY | Freq: Four times a day (QID) | RESPIRATORY_TRACT | 5 refills | Status: AC | PRN

## 2024-08-28 MED ORDER — FLUTICASONE PROPIONATE 50 MCG/ACT NA SUSP: 1.0000 | Freq: Every day | NASAL | 1 refills | Status: AC

## 2024-08-28 NOTE — Assessment & Plan Note (Signed)
Stable on albuterol as needed.  Will refill today. 

## 2024-08-28 NOTE — Assessment & Plan Note (Signed)
 Symptoms are still not fully controlled.  He does have good support structure with his family and church community.  We discussed treatment options.  He previously has been on Celexa  about had to discontinue due to side effects and he is not interested in any further SSRIs at this point.  We did discuss referral to therapist however he declined as well.  We discussed BuSpar  and he is agreeable to try this.  Will start 5 mg twice daily and he will follow-up with us  in a few weeks via MyChart and we can adjust as needed.

## 2024-08-28 NOTE — Patient Instructions (Signed)
 It was very nice to see you today!  VISIT SUMMARY: Today, you had your annual physical exam. We discussed your ongoing stress and anxiety, knee pain post-surgery, and occasional gastrointestinal discomfort. We also reviewed your general health maintenance.  YOUR PLAN: KNEE PAIN POST-SURGERY: Your knee function has improved since your surgery. -You are encouraged to gradually return to running, starting with jogging and increasing intensity as tolerated. Listen to your body and reduce activity if pain occurs.  ANXIETY: Your stress levels have improved with support from family, friends, and your church community. You are hesitant to use medications due to previous side effects. -We will try Buspar  at a low dose as a mild alternative. Please send a MyChart message in a few weeks to report on its effectiveness. -Continue with your regular exercise routine.  GASTROINTESTINAL DISCOMFORT: You experience a persistent sensation of trapped gas, temporarily relieved by Tums, with no specific dietary triggers identified. -Try Pepcid  or Pepcid  Complete for relief. -Monitor for any specific food triggers.  GENERAL HEALTH MAINTENANCE: Your blood pressure is slightly elevated, possibly due to stress or dehydration. -Refill albuterol  and Flonase  prescriptions. -Recheck your blood pressure before leaving. -Increase your water intake. -No immediate need for blood work; check every few years as appropriate for your age.  Return in about 1 year (around 08/28/2025) for Annual Physical.   Take care, Dr Kennyth  PLEASE NOTE:  If you had any lab tests, please let us  know if you have not heard back within a few days. You may see your results on mychart before we have a chance to review them but we will give you a call once they are reviewed by us .   If we ordered any referrals today, please let us  know if you have not heard from their office within the next week.   If you had any urgent prescriptions sent in  today, please check with the pharmacy within an hour of our visit to make sure the prescription was transmitted appropriately.   Please try these tips to maintain a healthy lifestyle:  Eat at least 3 REAL meals and 1-2 snacks per day.  Aim for no more than 5 hours between eating.  If you eat breakfast, please do so within one hour of getting up.   Each meal should contain half fruits/vegetables, one quarter protein, and one quarter carbs (no bigger than a computer mouse)  Cut down on sweet beverages. This includes juice, soda, and sweet tea.   Drink at least 1 glass of water with each meal and aim for at least 8 glasses per day  Exercise at least 150 minutes every week.    Preventive Care 73-65 Years Old, Male Preventive care refers to lifestyle choices and visits with your health care provider that can promote health and wellness. Preventive care visits are also called wellness exams. What can I expect for my preventive care visit? Counseling During your preventive care visit, your health care provider may ask about your: Medical history, including: Past medical problems. Family medical history. Current health, including: Emotional well-being. Home life and relationship well-being. Sexual activity. Lifestyle, including: Alcohol, nicotine or tobacco, and drug use. Access to firearms. Diet, exercise, and sleep habits. Safety issues such as seatbelt and bike helmet use. Sunscreen use. Work and work astronomer. Physical exam Your health care provider may check your: Height and weight. These may be used to calculate your BMI (body mass index). BMI is a measurement that tells if you are at a healthy weight.  Waist circumference. This measures the distance around your waistline. This measurement also tells if you are at a healthy weight and may help predict your risk of certain diseases, such as type 2 diabetes and high blood pressure. Heart rate and blood pressure. Body  temperature. Skin for abnormal spots. What immunizations do I need?  Vaccines are usually given at various ages, according to a schedule. Your health care provider will recommend vaccines for you based on your age, medical history, and lifestyle or other factors, such as travel or where you work. What tests do I need? Screening Your health care provider may recommend screening tests for certain conditions. This may include: Lipid and cholesterol levels. Diabetes screening. This is done by checking your blood sugar (glucose) after you have not eaten for a while (fasting). Hepatitis B test. Hepatitis C test. HIV (human immunodeficiency virus) test. STI (sexually transmitted infection) testing, if you are at risk. Talk with your health care provider about your test results, treatment options, and if necessary, the need for more tests. Follow these instructions at home: Eating and drinking  Eat a healthy diet that includes fresh fruits and vegetables, whole grains, lean protein, and low-fat dairy products. Drink enough fluid to keep your urine pale yellow. Take vitamin and mineral supplements as recommended by your health care provider. Do not drink alcohol if your health care provider tells you not to drink. If you drink alcohol: Limit how much you have to 0-2 drinks a day. Know how much alcohol is in your drink. In the [place] one drink equals one 12 oz bottle of beer (355 mL), one 5 oz glass of wine (148 mL), or one 1 oz glass of hard liquor (44 mL). Lifestyle Brush your teeth every morning and night with fluoride toothpaste. Floss one time each day. Exercise for at least 30 minutes 5 or more days each week. Do not use any products that contain nicotine or tobacco. These products include cigarettes, chewing tobacco, and vaping devices, such as e-cigarettes. If you need help quitting, ask your health care provider. Do not use drugs. If you are sexually active, practice safe sex. Use a  condom or other form of protection to prevent STIs. Find healthy ways to manage stress, such as: Meditation, yoga, or listening to music. Journaling. Talking to a trusted person. Spending time with friends and family. Minimize exposure to UV radiation to reduce your risk of skin cancer. Safety Always wear your seat belt while driving or riding in a vehicle. Do not drive: If you have been drinking alcohol. Do not ride with someone who has been drinking. If you have been using any mind-altering substances or drugs. While texting. When you are tired or distracted. Wear a helmet and other protective equipment during sports activities. If you have firearms in your house, make sure you follow all gun safety procedures. Seek help if you have been physically or sexually abused. What's next? Go to your health care provider once a year for an annual wellness visit. Ask your health care provider how often you should have your eyes and teeth checked. Stay up to date on all vaccines. This information is not intended to replace advice given to you by your health care provider. Make sure you discuss any questions you have with your health care provider. Document Revised: 02/04/2021 Document Reviewed: 02/04/2021 Elsevier Patient Education  2024 [employer].

## 2024-08-28 NOTE — Progress Notes (Signed)
 "  Chief Complaint:  Jeffrey Rangel is a 29 y.o. male who presents today for his annual comprehensive physical exam.    Assessment/Plan:  New/Acute Problems: Dyspepsia Patient reports occasional sensation of trapped gas in his upper abdomen.  Usually relieved with Tums but is not having specific dietary triggers.  Potentially could be GERD/gastritis.  No red flags.  Overall reassuring exam today.  Recommended trial over-the-counter Pepcid .  He will let us  know if not improving.  Chronic Problems Addressed Today: Anxiety Symptoms are still not fully controlled.  He does have good support structure with his family and church community.  We discussed treatment options.  He previously has been on Celexa  about had to discontinue due to side effects and he is not interested in any further SSRIs at this point.  We did discuss referral to therapist however he declined as well.  We discussed BuSpar  and he is agreeable to try this.  Will start 5 mg twice daily and he will follow-up with us  in a few weeks via MyChart and we can adjust as needed.  Allergic rhinitis Stable on Zyrtec  10 mg daily and Flonase  daily.  Reactive airway disease Stable on albuterol  as needed.  Will refill today.  Preventative Healthcare: Vaccines and labs deferred.  Patient Counseling(The following topics were reviewed and/or handout was given):  -Nutrition: Stressed importance of moderation in sodium/caffeine intake, saturated fat and cholesterol, caloric balance, sufficient intake of fresh fruits, vegetables, and fiber.  -Stressed the importance of regular exercise.   -Substance Abuse: Discussed cessation/primary prevention of tobacco, alcohol, or other drug use; driving or other dangerous activities under the influence; availability of treatment for abuse.   -Injury prevention: Discussed safety belts, safety helmets, smoke detector, smoking near bedding or upholstery.   -Sexuality: Discussed sexually transmitted  diseases, partner selection, use of condoms, avoidance of unintended pregnancy and contraceptive alternatives.   -Dental health: Discussed importance of regular tooth brushing, flossing, and dental visits.  -Health maintenance and immunizations reviewed. Please refer to Health maintenance section.  Return to care in 1 year for next preventative visit.     Subjective:  HPI:  He has no acute complaints today. Patient is here today for his annual physical.  See assessment / plan for status of chronic conditions.  Discussed the use of AI scribe software for clinical note transcription with the patient, who gave verbal consent to proceed.  History of Present Illness Jeffrey Rangel is a 29 year old male who presents for an annual physical exam.  He has been experiencing ongoing stress and anxiety, which was the main topic of discussion during a previous virtual visit about a month ago. He feels better with support from family, friends, and his church community. He is hesitant to resume medication due to previous side effects from Celexa , which caused 'fighting in my sleep'.  He has been engaging in daily exercise for the past three weeks, including push-ups, crunches, and resistance band exercises. He has a history of knee surgery, which has impacted his physical activity, but reports improvement and is now able to do squats. He is considering returning to running, a sport he enjoyed in school, but is cautious about his knee's response.  He experiences occasional trapped gas, feeling like he needs to burp. He has tried Tums and simethicone, which provide temporary relief. He has not identified any specific food triggers and has not tried Pepcid  yet.      01/28/2023   11:48 AM  Depression screen  PHQ 2/9  Decreased Interest 0  Down, Depressed, Hopeless 0  PHQ - 2 Score 0    There are no preventive care reminders to display for this patient.   ROS: Per HPI, otherwise a complete review of  systems was negative.   PMH:  The following were reviewed and entered/updated in epic: Past Medical History:  Diagnosis Date   Allergy    Anxiety    Bronchitis    hx of    COPD (chronic obstructive pulmonary disease) (HCC)    Epigastric hernia 02/2014   GERD (gastroesophageal reflux disease)    Headache 01/28/2023   Hypertension    Patient Active Problem List   Diagnosis Date Noted   Headache 01/28/2023   Panic disorder 01/20/2023   Anxiety 01/20/2023   Allergic rhinitis 01/20/2023   Reactive airway disease 01/20/2023   Past Surgical History:  Procedure Laterality Date   EPIGASTRIC HERNIA REPAIR N/A 03/06/2014   Procedure: HERNIA REPAIR EPIGASTRIC PEDIATRIC X2;  Surgeon: CHRISTELLA. Julietta Millman, MD;  Location: [facility];  Service: Pediatrics;  Laterality: N/A;   HERNIA REPAIR     KNEE SURGERY Right 2025   SCROTAL EXPLORATION Bilateral 07/08/2013   Procedure: SCROTAL EXPLORATION WITH BILATERAL ORICHIOPEXY;  Surgeon: Garnette Shack, MD;  Location[facility];  Service: Urology;  Laterality: Bilateral;   UMBILICAL HERNIA REPAIR N/A 10/13/2020   Procedure: OPEN HERNIA REPAIR UMBILICAL ADULT WITH MESH PATCH;  Surgeon: Eletha Boas, MD;  Location: [facility];  Service: General;  Laterality: N/A;   VENTRAL HERNIA REPAIR N/A 10/13/2020   Procedure: OPEN REPAIR RECURRENT HERNIA REPAIR VENTRAL ADULT WITH MESH PATCH;  Surgeon: Eletha Boas, MD;  Location: [facility];  Service: General;  Laterality: N/A;    Family History  Problem Relation Age of Onset   Hypertension Mother    Asthma Mother    Liver disease Father    Asthma Brother    Diabetes Maternal Grandmother    Diabetes Maternal Grandfather     Medications- reviewed and updated Current Outpatient Medications  Medication Sig Dispense Refill   busPIRone  (BUSPAR ) 5 MG tablet Take 1 tablet (5 mg total) by mouth 2 (two) times daily. 60 tablet 5   cetirizine  (ZYRTEC  ALLERGY) 10 MG tablet Take 1 tablet (10 mg total) by mouth  at bedtime. (Patient taking differently: Take 10 mg by mouth as needed.) 90 tablet 1   ibuprofen  (ADVIL ) 600 MG tablet Take 1 tablet (600 mg total) by mouth every 6 (six) hours as needed. 30 tablet 0   SUMAtriptan  (IMITREX ) 50 MG tablet Take 1 tablet (50 mg total) by mouth every 2 (two) hours as needed for migraine. May repeat in 2 hours if headache persists or recurs. 10 tablet 0   albuterol  (VENTOLIN  HFA) 108 (90 Base) MCG/ACT inhaler Inhale 2 puffs into the lungs every 6 (six) hours as needed for wheezing or shortness of breath (Cough). 18 g 5   fluticasone  (FLONASE ) 50 MCG/ACT nasal spray Place 1 spray into both nostrils daily. 47.4 mL 1   No current facility-administered medications for this visit.    Allergies-reviewed and updated Allergies[1]  Social History   Socioeconomic History   Marital status: Single    Spouse name: Not on file   Number of children: Not on file   Years of education: Not on file   Highest education level: Not on file  Occupational History   Not on file  Tobacco Use   Smoking status: Never   Smokeless tobacco: Never  Vaping Use   Vaping status: Never Used  Substance and Sexual Activity   Alcohol use: Yes    Alcohol/week: 4.0 standard drinks of alcohol    Types: 4 Shots of liquor per week    Comment: occ   Drug use: Never   Sexual activity: Yes    Birth control/protection: Condom  Other Topics Concern   Not on file  Social History Narrative   Not on file   Social Drivers of Health   Tobacco Use: Low Risk (08/28/2024)   Patient History    Smoking Tobacco Use: Never    Smokeless Tobacco Use: Never    Passive Exposure: Not on file  Financial Resource Strain: Not on file  Food Insecurity: Not on file  Transportation Needs: Not on file  Physical Activity: Unknown (01/28/2023)   Exercise Vital Sign    Days of Exercise per Week: 1 day    Minutes of Exercise per Session: Patient declined  Stress: No Stress Concern Present (01/28/2023)   [employer] - Occupational Stress Questionnaire    Feeling of Stress : Only a little  Social Connections: Unknown (08/28/2024)   Social Connection and Isolation Panel    Frequency of Communication with Friends and Family: Not on file    Frequency of Social Gatherings with Friends and Family: Not on file    Attends Religious Services: More than 4 times per year    Active Member of Clubs or Organizations: Not on file    Attends Banker Meetings: Not on file    Marital Status: Not on file  Depression (PHQ2-9): Low Risk (01/28/2023)   Depression (PHQ2-9)    PHQ-2 Score: 0  Alcohol Screen: Not on file  Housing: Low Risk (01/28/2023)   Housing    Last Housing Risk Score: 0  Utilities: Not on file  Health Literacy: Not on file        Objective:  Physical Exam: BP 136/82 (BP Location: Right Arm, Patient Position: Sitting, Cuff Size: Normal)   Pulse 67   Temp 97.7 F (36.5 C) (Temporal)   Ht 6' 5 (1.956 m)   Wt 213 lb 9.6 oz (96.9 kg)   SpO2 99%   BMI 25.33 kg/m   Body mass index is 25.33 kg/m. Wt Readings from Last 3 Encounters:  08/28/24 213 lb 9.6 oz (96.9 kg)  01/13/24 200 lb (90.7 kg)  07/16/23 190 lb (86.2 kg)   Gen: NAD, resting comfortably HEENT: TMs normal bilaterally. OP clear. No thyromegaly noted.  CV: RRR with no murmurs appreciated Pulm: NWOB, CTAB with no crackles, wheezes, or rhonchi GI: Normal bowel sounds present. Soft, Nontender, Nondistended. MSK: no edema, cyanosis, or clubbing noted Skin: warm, dry Neuro: CN2-12 grossly intact. Strength 5/5 in upper and lower extremities. Reflexes symmetric and intact bilaterally.  Psych: Normal affect and thought content     Cathyrn Deas M. Kennyth, MD 08/28/2024 12:05 PM     [1]  Allergies Allergen Reactions   Other     green peppers   "

## 2024-08-28 NOTE — Assessment & Plan Note (Signed)
 Stable on Zyrtec  10 mg daily and Flonase  daily.

## 2024-08-28 NOTE — Telephone Encounter (Signed)
"   CALLED PT AND LET THEM KNOW THAT THEY WILL BE BILLED IF THEY DON'T RECOVER THEIR INSURANCE AND GET AN UPDATED ONE SENT OVER TO HAVE ON FILE. PT HAD [employer] MANAGED INSURANCE, STATUS IS INEFFECTIVE. MELYNDA "

## 2024-09-04 NOTE — Telephone Encounter (Signed)
 Called and let them know again

## 2024-09-13 ENCOUNTER — Ambulatory Visit: Payer: Self-pay

## 2024-09-13 ENCOUNTER — Encounter: Payer: Self-pay | Admitting: Family Medicine

## 2024-09-13 NOTE — Telephone Encounter (Signed)
 Noted.

## 2024-09-13 NOTE — Telephone Encounter (Signed)
 Patient called back, family member is on the way to give him a ride to ED.

## 2024-09-13 NOTE — Telephone Encounter (Signed)
 FYI Only or Action Required?: FYI only for provider: ED advised.  Patient was last seen in primary care on 08/28/2024 by Kennyth Worth HERO, MD.  Called Nurse Triage reporting Shortness of Breath.  Symptoms began did not answer.  Interventions attempted: Rest, hydration, or home remedies.  Symptoms are: rapidly worsening.  Triage Disposition: Go to ED Now (Notify PCP)  Patient/caregiver understands and will follow disposition?: Yes      Reason for Disposition  Difficulty breathing  Answer Assessment - Initial Assessment Questions This RN recommended pt be examined in hospital asap. Pt verbalized understanding, will head to ER via loved one if can find a ride, accepted offer for call back to ensure got ride. Advised call 911 if any new or worsening symptoms. Nurse attempted to call pt x2 with no ringing or answer, LVM x1 for call back to office if ride not obtained.      1. LOCATION: Where does it hurt?       Not the heart  2. RADIATION: Does the pain go anywhere else? (e.g., into neck, jaw, arms, back)     Back  4. PATTERN: Does the pain come and go, or has it been constant since it started?  Does it get worse with exertion?      Worse in mornings Clears up after workout or lay down Worse with exposure to cold Can be positional  5. DURATION: How long does it last (e.g., seconds, minutes, hours)     Didn't answer  6. SEVERITY: How bad is the pain?  (e.g., Scale 1-10; mild, moderate, or severe)     More of an ache feeling Like a crushed feeling, lot of pressure on you, like got hit real hard or something, feels like a muscle spasm  8. PULMONARY RISK FACTORS: Do you have any history of lung disease?  (e.g., blood clots in lung, asthma, emphysema, birth control pills)     Reactive airway per pt chart  9. CAUSE: What do you think is causing the chest pain?     Think bronchitis Nose kind of stuffy early in mornings, using nasal spray, clears it up No  cough or sore throat Think from breathing   10. OTHER SYMPTOMS: Do you have any other symptoms? (e.g., dizziness, nausea, vomiting, sweating, fever, difficulty breathing, cough)       Definitely some shortness of breath Get a little tired with chest pain/back pain/SOB Lot of acid reflux got med for it but not picked it up yet  Denies: Cold/clammy skin Heart racing Too weak to stand Nausea  Protocols used: Chest Pain-A-AH

## 2024-09-13 NOTE — Telephone Encounter (Signed)
 triage needed. Thanks

## 2024-09-13 NOTE — Telephone Encounter (Signed)
Currently being triaged

## 2025-08-30 ENCOUNTER — Encounter: Payer: Self-pay | Admitting: Family Medicine
